# Patient Record
Sex: Female | Born: 1955 | Race: White | Hispanic: No | Marital: Married | Smoking: Current every day smoker
Health system: Southern US, Community
[De-identification: ages and names within clinical notes are randomized; demographics above are authoritative.]

## PROBLEM LIST (undated history)

## (undated) DIAGNOSIS — F419 Anxiety disorder, unspecified: Secondary | ICD-10-CM

## (undated) DIAGNOSIS — F32A Depression, unspecified: Secondary | ICD-10-CM

## (undated) DIAGNOSIS — R059 Cough, unspecified: Secondary | ICD-10-CM

## (undated) DIAGNOSIS — G43909 Migraine, unspecified, not intractable, without status migrainosus: Secondary | ICD-10-CM

## (undated) DIAGNOSIS — K635 Polyp of colon: Secondary | ICD-10-CM

## (undated) DIAGNOSIS — T7840XA Allergy, unspecified, initial encounter: Secondary | ICD-10-CM

## (undated) DIAGNOSIS — R05 Cough: Secondary | ICD-10-CM

## (undated) DIAGNOSIS — F329 Major depressive disorder, single episode, unspecified: Secondary | ICD-10-CM

## (undated) HISTORY — DX: Depression, unspecified: F32.A

## (undated) HISTORY — DX: Anxiety disorder, unspecified: F41.9

## (undated) HISTORY — PX: APPENDECTOMY: SHX54

## (undated) HISTORY — DX: Major depressive disorder, single episode, unspecified: F32.9

## (undated) HISTORY — DX: Migraine, unspecified, not intractable, without status migrainosus: G43.909

## (undated) HISTORY — PX: POLYPECTOMY: SHX149

## (undated) HISTORY — PX: BREAST SURGERY: SHX581

## (undated) HISTORY — PX: COLONOSCOPY: SHX174

## (undated) HISTORY — PX: ABDOMINAL HYSTERECTOMY: SHX81

## (undated) HISTORY — DX: Allergy, unspecified, initial encounter: T78.40XA

---

## 2000-01-15 ENCOUNTER — Other Ambulatory Visit: Admission: RE | Admit: 2000-01-15 | Discharge: 2000-01-15 | Payer: Self-pay | Admitting: Family Medicine

## 2012-03-31 ENCOUNTER — Other Ambulatory Visit: Payer: Self-pay | Admitting: Family Medicine

## 2012-06-22 ENCOUNTER — Ambulatory Visit (INDEPENDENT_AMBULATORY_CARE_PROVIDER_SITE_OTHER): Payer: BC Managed Care – PPO | Admitting: Physician Assistant

## 2012-06-22 ENCOUNTER — Encounter: Payer: Self-pay | Admitting: Physician Assistant

## 2012-06-22 VITALS — BP 138/81 | HR 63 | Temp 97.9°F | Ht 63.75 in | Wt 112.0 lb

## 2012-06-22 DIAGNOSIS — R195 Other fecal abnormalities: Secondary | ICD-10-CM

## 2012-06-22 DIAGNOSIS — J309 Allergic rhinitis, unspecified: Secondary | ICD-10-CM

## 2012-06-22 DIAGNOSIS — E785 Hyperlipidemia, unspecified: Secondary | ICD-10-CM

## 2012-06-22 DIAGNOSIS — F411 Generalized anxiety disorder: Secondary | ICD-10-CM

## 2012-06-22 LAB — POCT CBC
Hemoglobin: 14.1 g/dL (ref 12.2–16.2)
MCHC: 34.2 g/dL (ref 31.8–35.4)
MPV: 6.5 fL (ref 0–99.8)
POC Granulocyte: 4.7 (ref 2–6.9)
POC LYMPH PERCENT: 30.6 %L (ref 10–50)
RDW, POC: 12.8 %

## 2012-06-22 MED ORDER — ALPRAZOLAM 1 MG PO TABS: 1.0000 mg | ORAL_TABLET | Freq: Every evening | ORAL | Status: DC | PRN

## 2012-06-22 MED ORDER — FLUTICASONE PROPIONATE 50 MCG/ACT NA SUSP: 2.0000 | Freq: Every day | NASAL | Status: DC

## 2012-06-22 MED ORDER — OMEGA-3 FATTY ACIDS 1000 MG PO CAPS: 1.0000 g | ORAL_CAPSULE | Freq: Every day | ORAL | Status: DC

## 2012-06-22 MED ORDER — ESCITALOPRAM OXALATE 10 MG PO TABS: 10.0000 mg | ORAL_TABLET | Freq: Every day | ORAL | Status: DC

## 2012-06-22 NOTE — Patient Instructions (Signed)
Allergic Rhinitis Allergic rhinitis is when the mucous membranes in the nose respond to allergens. Allergens are particles in the air that cause your body to have an allergic reaction. This causes you to release allergic antibodies. Through a chain of events, these eventually cause you to release histamine into the blood stream (hence the use of antihistamines). Although meant to be protective to the body, it is this release that causes your discomfort, such as frequent sneezing, congestion and an itchy runny nose.  CAUSES  The pollen allergens may come from grasses, trees, and weeds. This is seasonal allergic rhinitis, or "hay fever." Other allergens cause year-round allergic rhinitis (perennial allergic rhinitis) such as house dust mite allergen, pet dander and mold spores.  SYMPTOMS   Nasal stuffiness (congestion).  Runny, itchy nose with sneezing and tearing of the eyes.  There is often an itching of the mouth, eyes and ears. It cannot be cured, but it can be controlled with medications. DIAGNOSIS  If you are unable to determine the offending allergen, skin or blood testing may find it. TREATMENT   Avoid the allergen.  Medications and allergy shots (immunotherapy) can help.  Hay fever may often be treated with antihistamines in pill or nasal spray forms. Antihistamines block the effects of histamine. There are over-the-counter medicines that may help with nasal congestion and swelling around the eyes. Check with your caregiver before taking or giving this medicine. If the treatment above does not work, there are many new medications your caregiver can prescribe. Stronger medications may be used if initial measures are ineffective. Desensitizing injections can be used if medications and avoidance fails. Desensitization is when a patient is given ongoing shots until the body becomes less sensitive to the allergen. Make sure you follow up with your caregiver if problems continue. SEEK MEDICAL  CARE IF:   You develop fever (more than 100.5 F (38.1 C).  You develop a cough that does not stop easily (persistent).  You have shortness of breath.  You start wheezing.  Symptoms interfere with normal daily activities. Document Released: 09/16/2000 Document Revised: 03/16/2011 Document Reviewed: 03/28/2008 Exit[employer] Patient Information 2014 [employer].  

## 2012-06-22 NOTE — Progress Notes (Signed)
Subjective:     Patient ID: [name], female   DOB: [birth year], 57 y.o.   MRN: [id number]  HPI Pt here for review of her anxiety She states she has been well on current meds She has just been moved to the 1st shift so this has helped She still uses 1/2 of the xanax on occasion at night to help shut her mind down for sleep Pt also with dry cough, congestion, and PND No fever or chills  Review of Systems  All other systems reviewed and are negative.       Objective:   Physical Exam  Nursing note and vitals reviewed. Mood approp and good interaction in room Good eye contact during interview Ears- canals/TM's nl Oral- no lesions, + clear PND No cerv nodes Heart- RRR w/o M Lungs- CTA     Assessment:     1. Heme positive stool   2. Anxiety state, unspecified   3. Other and unspecified hyperlipidemia   4. Allergic rhinitis        Plan:     Add Flonase rx to help with sx Cont other meds and rf done for 6 months   CBC done per [facility] at last visit Pt also given repeat hemoccult due to prev + She is not wanting to do colonoscopy but really stressed the importance Will inform of lab results

## 2012-06-30 ENCOUNTER — Other Ambulatory Visit (INDEPENDENT_AMBULATORY_CARE_PROVIDER_SITE_OTHER): Payer: BC Managed Care – PPO

## 2012-06-30 DIAGNOSIS — Z1212 Encounter for screening for malignant neoplasm of rectum: Secondary | ICD-10-CM

## 2012-07-01 LAB — FECAL OCCULT BLOOD, IMMUNOCHEMICAL: Fecal Occult Blood: POSITIVE — AB

## 2012-07-05 ENCOUNTER — Ambulatory Visit (INDEPENDENT_AMBULATORY_CARE_PROVIDER_SITE_OTHER): Payer: BC Managed Care – PPO | Admitting: Physician Assistant

## 2012-07-05 ENCOUNTER — Encounter: Payer: Self-pay | Admitting: Physician Assistant

## 2012-07-05 VITALS — BP 122/74 | HR 68 | Temp 98.6°F | Wt 116.0 lb

## 2012-07-05 DIAGNOSIS — R195 Other fecal abnormalities: Secondary | ICD-10-CM

## 2012-07-05 DIAGNOSIS — J329 Chronic sinusitis, unspecified: Secondary | ICD-10-CM

## 2012-07-05 MED ORDER — AMOXICILLIN 500 MG PO CAPS: 500.0000 mg | ORAL_CAPSULE | Freq: Three times a day (TID) | ORAL | Status: DC

## 2012-07-05 MED ORDER — BENZONATATE 200 MG PO CAPS: 200.0000 mg | ORAL_CAPSULE | Freq: Two times a day (BID) | ORAL | Status: DC | PRN

## 2012-07-05 NOTE — Patient Instructions (Signed)

## 2012-07-05 NOTE — Progress Notes (Signed)
Subjective:     Patient ID: Alyssa Reyes, female   DOB: 07-Jun-1955, 57 y.o.   MRN: [id number]  HPI Pt with progressive sinus pressure and congestion She has noticed increase in fatigue and general malaise + Prod cough in the am No N/V/D She has used the antihist/Flonase  Review of Systems     Objective:   Physical Exam + Frontal sinus TTP Ears- canals nl, Fluid but landmark remain bilat Oral- Thick post drainage, no increase tonsil size No cerv nodes Heart- RRR w/o M Lungs- CTA    Assessment:     1. Heme positive stool   2. Sinusitis        Plan:     Amox 500mg  tid  for sinus infection  Tessalon for cough Cont other meds She also has had another hem + result so will set up for colonoscopy F/U prn

## 2012-07-12 ENCOUNTER — Encounter: Payer: Self-pay | Admitting: Gastroenterology

## 2012-07-12 ENCOUNTER — Other Ambulatory Visit: Payer: Self-pay | Admitting: Physician Assistant

## 2012-08-15 ENCOUNTER — Encounter: Payer: Self-pay | Admitting: Gastroenterology

## 2012-08-15 ENCOUNTER — Ambulatory Visit (INDEPENDENT_AMBULATORY_CARE_PROVIDER_SITE_OTHER): Payer: BC Managed Care – PPO | Admitting: Gastroenterology

## 2012-08-15 VITALS — BP 120/70 | HR 80 | Ht 63.75 in | Wt 115.4 lb

## 2012-08-15 DIAGNOSIS — R195 Other fecal abnormalities: Secondary | ICD-10-CM

## 2012-08-15 MED ORDER — NA SULFATE-K SULFATE-MG SULF 17.5-3.13-1.6 GM/177ML PO SOLN: 1.0000 | Freq: Once | ORAL | Status: DC

## 2012-08-15 NOTE — Assessment & Plan Note (Signed)
Plan colonoscopy.  Risks, alternatives, and complications of the procedure, including bleeding, perforation, and possible need for surgery, were explained to the patient.  Patient's questions were answered.

## 2012-08-15 NOTE — Progress Notes (Signed)
History of Present Illness: Pleasant, 57 year old white female, referred at the request of Dr. Christell Constant for Hemoccult-positive stool.  This was noted on 2 different occasions.  The patient has no GI complaints, including change of bowel habits, abdominal pain, or rectal bleeding.  Recent CBC was normal.    Past Medical History  Diagnosis Date  . Migraines   . Anxiety    Past Surgical History  Procedure Laterality Date  . Abdominal hysterectomy    . Breast surgery Bilateral     "Knot" removal   . Appendectomy     family history includes Diabetes in her brother, mother, and sister; Heart disease in her brother, father, and mother; Hyperlipidemia in her brother and mother; Hypertension in her brother, mother, and sister; and Stroke in her father. Current Outpatient Prescriptions  Medication Sig Dispense Refill  . ALPRAZolam (XANAX) 1 MG tablet Take 1 tablet (1 mg total) by mouth at bedtime as needed for sleep.  30 tablet  5  . escitalopram (LEXAPRO) 10 MG tablet Take 1 tablet (10 mg total) by mouth daily.  30 tablet  6  . Multiple Vitamins-Minerals (MULTIVITAMIN WITH MINERALS) tablet Take 1 tablet by mouth daily.       No current facility-administered medications for this visit.   Allergies as of 08/15/2012 - Review Complete 08/15/2012  Allergen Reaction Noted  . Erythromycin Nausea And Vomiting 06/22/2012  . Ultracet (tramadol-acetaminophen) Itching 06/22/2012  . Vicodin (hydrocodone-acetaminophen) Nausea And Vomiting 06/22/2012    reports that she has been smoking Cigarettes.  She has a 15 pack-year smoking history. She has never used smokeless tobacco. She reports that she does not drink alcohol or use illicit drugs.     Review of Systems: Pertinent positive and negative review of systems were noted in the above HPI section. All other review of systems were otherwise negative.  Vital signs were reviewed in today's medical record Physical Exam: General: Well developed , well  nourished, no acute distress Skin: anicteric Head: Normocephalic and atraumatic Eyes:  sclerae anicteric, EOMI Ears: Normal auditory acuity Mouth: No deformity or lesions Neck: Supple, no masses or thyromegaly Lungs: Clear throughout to auscultation Heart: Regular rate and rhythm; no murmurs, rubs or bruits Abdomen: Soft, non tender and non distended. No masses, hepatosplenomegaly or hernias noted. Normal Bowel sounds Rectal:deferred Musculoskeletal: Symmetrical with no gross deformities  Skin: No lesions on visible extremities Pulses:  Normal pulses noted Extremities: No clubbing, cyanosis, edema or deformities noted Neurological: Alert oriented x 4, grossly nonfocal Cervical Nodes:  No significant cervical adenopathy Inguinal Nodes: No significant inguinal adenopathy Psychological:  Alert and cooperative. Normal mood and affect

## 2012-08-15 NOTE — Patient Instructions (Addendum)

## 2012-09-29 ENCOUNTER — Ambulatory Visit (INDEPENDENT_AMBULATORY_CARE_PROVIDER_SITE_OTHER): Payer: BC Managed Care – PPO | Admitting: Family Medicine

## 2012-09-29 ENCOUNTER — Encounter: Payer: Self-pay | Admitting: Family Medicine

## 2012-09-29 VITALS — BP 129/70 | HR 71 | Temp 98.7°F | Ht 63.75 in | Wt 117.0 lb

## 2012-09-29 DIAGNOSIS — J209 Acute bronchitis, unspecified: Secondary | ICD-10-CM

## 2012-09-29 DIAGNOSIS — J329 Chronic sinusitis, unspecified: Secondary | ICD-10-CM

## 2012-09-29 MED ORDER — METHYLPREDNISOLONE (PAK) 4 MG PO TABS: ORAL_TABLET | ORAL | Status: DC

## 2012-09-29 MED ORDER — AZITHROMYCIN 250 MG PO TABS: ORAL_TABLET | ORAL | Status: DC

## 2012-09-29 MED ORDER — BENZONATATE 200 MG PO CAPS: 200.0000 mg | ORAL_CAPSULE | Freq: Two times a day (BID) | ORAL | Status: DC | PRN

## 2012-09-29 NOTE — Patient Instructions (Signed)

## 2012-09-29 NOTE — Progress Notes (Signed)
  Subjective:    Patient ID: Alyssa Reyes, female    DOB: 1955-02-10, 57 y.o.   MRN: [id number]  HPI This 57 y.o. female presents for evaluation of URI sx's for over a week.   Review of Systems  No chest pain, SOB, HA, dizziness, vision change, N/V, diarrhea, constipation, dysuria, urinary urgency or frequency, myalgias, arthralgias or rash.     Objective:   Physical Exam Vital signs noted  Well developed well nourished female.  HEENT - Head atraumatic Normocephalic                Eyes - PERRLA, Conjuctiva - clear Sclera- Clear EOMI                Ears - EAC's Wnl TM's Wnl Gross Hearing WNL                Nose - Nares patent                 Throat - oropharanx wnl Respiratory - Lungs CTA bilateral Cardiac - RRR S1 and S2 without murmur        Assessment & Plan:  Sinusitis - Plan: azithromycin (ZITHROMAX) 250 MG tablet, benzonatate (TESSALON) 200 MG capsule  Acute bronchitis - Plan: azithromycin (ZITHROMAX) 250 MG tablet, methylPREDNIsolone (MEDROL DOSPACK) 4 MG tablet, benzonatate (TESSALON) 200 MG capsule  Deatra Canter FNP

## 2012-10-03 ENCOUNTER — Ambulatory Visit (AMBULATORY_SURGERY_CENTER): Payer: BC Managed Care – PPO | Admitting: Gastroenterology

## 2012-10-03 ENCOUNTER — Encounter: Payer: Self-pay | Admitting: Gastroenterology

## 2012-10-03 VITALS — BP 172/82 | HR 63 | Temp 95.2°F | Resp 18 | Ht 63.75 in | Wt 115.0 lb

## 2012-10-03 DIAGNOSIS — D126 Benign neoplasm of colon, unspecified: Secondary | ICD-10-CM

## 2012-10-03 DIAGNOSIS — R195 Other fecal abnormalities: Secondary | ICD-10-CM

## 2012-10-03 MED ORDER — SODIUM CHLORIDE 0.9 % IV SOLN: 500.0000 mL | INTRAVENOUS | Status: DC

## 2012-10-03 NOTE — Progress Notes (Signed)
Called to room to assist during endoscopic procedure.  Patient ID and intended procedure confirmed with present staff. Received instructions for my participation in the procedure from the performing physician.  

## 2012-10-03 NOTE — Progress Notes (Signed)
Patient did not experience any of the following events: a burn prior to discharge; a fall within the facility; wrong site/side/patient/procedure/implant event; or a hospital transfer or hospital admission upon discharge from the facility. (G8907) Patient did not have preoperative order for IV antibiotic SSI prophylaxis. (G8918)  

## 2012-10-03 NOTE — Patient Instructions (Addendum)
YOU HAD AN ENDOSCOPIC PROCEDURE TODAY AT THE [facility]: Refer to the procedure report that was given to you for any specific questions about what was found during the examination.  If the procedure report does not answer your questions, please call your gastroenterologist to clarify.  If you requested that your care partner not be given the details of your procedure findings, then the procedure report has been included in a sealed envelope for you to review at your convenience later.  YOU SHOULD EXPECT: Some feelings of bloating in the abdomen. Passage of more gas than usual.  Walking can help get rid of the air that was put into your GI tract during the procedure and reduce the bloating. If you had a lower endoscopy (such as a colonoscopy or flexible sigmoidoscopy) you may notice spotting of blood in your stool or on the toilet paper. If you underwent a bowel prep for your procedure, then you may not have a normal bowel movement for a few days.  DIET: Your first meal following the procedure should be a light meal and then it is ok to progress to your normal diet.  A half-sandwich or bowl of soup is an example of a good first meal.  Heavy or fried foods are harder to digest and may make you feel nauseous or bloated.  Likewise meals heavy in dairy and vegetables can cause extra gas to form and this can also increase the bloating.  Drink plenty of fluids but you should avoid alcoholic beverages for 24 hours.  ACTIVITY: Your care partner should take you home directly after the procedure.  You should plan to take it easy, moving slowly for the rest of the day.  You can resume normal activity the day after the procedure however you should NOT DRIVE or use heavy machinery for 24 hours (because of the sedation medicines used during the test).    SYMPTOMS TO REPORT IMMEDIATELY: A gastroenterologist can be reached at any hour.  During normal business hours, 8:30 AM to 5:00 PM Monday through Friday,  call [phone].  After hours and on weekends, please call the GI answering service at [phone] who will take a message and have the physician on call contact you.   Following lower endoscopy (colonoscopy or flexible sigmoidoscopy):  Excessive amounts of blood in the stool  Significant tenderness or worsening of abdominal pains  Swelling of the abdomen that is new, acute  Fever of 100F or higher  FOLLOW UP: If any biopsies were taken you will be contacted by phone or by letter within the next 1-3 weeks.  Call your gastroenterologist if you have not heard about the biopsies in 3 weeks.  Our staff will call the home number listed on your records the next business day following your procedure to check on you and address any questions or concerns that you may have at that time regarding the information given to you following your procedure. This is a courtesy call and so if there is no answer at the home number and we have not heard from you through the emergency physician on call, we will assume that you have returned to your regular daily activities without incident.  SIGNATURES/CONFIDENTIALITY: You and/or your care partner have signed paperwork which will be entered into your electronic medical record.  These signatures attest to the fact that that the information above on your After Visit Summary has been reviewed and is understood.  Full responsibility of the confidentiality of this   discharge information lies with you and/or your care-partner.    Resume medications. Information given on polyps with discharge instructions. 

## 2012-10-03 NOTE — Op Note (Signed)
[facility] [address] [place], [address]   COLONOSCOPY PROCEDURE REPORT  PATIENT: Alyssa Reyes, Alyssa Reyes  MR#: [id number] BIRTHDATE: 01-Jul-1955 , 56  yrs. old GENDER: Female ENDOSCOPIST: Louis Meckel, MD REFERRED NW:[id number] Christell Constant, M.D. PROCEDURE DATE:  10/03/2012 PROCEDURE:   Colonoscopy with biopsy, Colonoscopy with snare polypectomy, Submucosal injection, any substance, and Colonoscopy with control of bleeding First Screening Colonoscopy - Avg.  risk and is 50 yrs.  old or older Yes.  Prior Negative Screening - Now for repeat screening. N/A  History of Adenoma - Now for follow-up colonoscopy & has been > or = to 3 yrs.  N/A  Polyps Removed Today? Yes. ASA CLASS:   Class I INDICATIONS:heme-positive stool. MEDICATIONS: MAC sedation, administered by CRNA and propofol (Diprivan) 400mg  IV  DESCRIPTION OF PROCEDURE:   After the risks benefits and alternatives of the procedure were thoroughly explained, informed consent was obtained.  A digital rectal exam revealed no abnormalities of the rectum.   The LB [id number]  endoscope was introduced through the anus and advanced to the cecum, which was identified by both the appendix and ileocecal valve. No adverse events experienced.   The quality of the prep was excellent using Suprep  The instrument was then slowly withdrawn as the colon was fully examined.      COLON FINDINGS: In the cecum there was a flat, sessile polyp with some nodularity encompassing at least 50% of the circumference of the cecum.  Multiple biopsies were taken. In the ascending colon there was a 15 mm flat, sessile polyp.  After injecting 4 cc submucosally of normal saline, the polyp was removed with hot polypectomy snare and submitted to pathology.  Polyp remnants were removed with a hot biopsy forceps.  Because of mild but persistent bleeding 2 endoscopic clips were applied, achieving hemostasis. In the splenic flexure there was a 1  cm sessile polyp.  This was removed with cold polypectomy snare and submitted to pathology. Retroflexed views revealed no abnormalities. The time to cecum=4 minutes 06 seconds.  Withdrawal time=21 minutes 15 seconds.  The scope was withdrawn and the procedure completed. COMPLICATIONS: There were no complications.  ENDOSCOPIC IMPRESSION: In the cecum there was a flat, sessile polyp with some nodularity encompassing at least 50% of the circumference of the cecum. Multiple biopsies were taken. In the ascending colon there was a 15 mm flat, sessile polyp.  After injecting 4 cc submucosally of normal saline, the polyp was removed with hot polypectomy snare and submitted to pathology.  Polyp remnants were removed with a hot biopsy forceps.   Because of mild but persistent bleeding 2 endoscopic clips were applied, achieving hemostasis. In the splenic flexure there was a 1 cm sessile polyp.  This was removed with cold polypectomy snare and submitted to pathology.  RECOMMENDATIONS: 1.  My office will arrange for you to meet with a surgeon. 2.  Colonoscopy in 1 year with [facility]   eSigned:  Louis Meckel, MD 10/03/2012 2:51 PM   cc: Dr. Karie Soda   PATIENT NAME:  Alyssa Reyes, Alyssa Reyes MR#: [id number]

## 2012-10-04 ENCOUNTER — Telehealth: Payer: Self-pay | Admitting: *Deleted

## 2012-10-04 ENCOUNTER — Other Ambulatory Visit: Payer: Self-pay

## 2012-10-04 DIAGNOSIS — K635 Polyp of colon: Secondary | ICD-10-CM

## 2012-10-04 NOTE — Telephone Encounter (Signed)
  Follow up Call-  Call back number 10/03/2012  Post procedure Call Back phone  # [phone]  Permission to leave phone message Yes     Patient questions:  Do you have a fever, pain , or abdominal swelling? no Pain Score  0 *  Have you tolerated food without any problems? No  Patient states not much of an appetite.  Encouraged to eat a little.  Have you been able to return to your normal activities? yes  Do you have any questions about your discharge instructions: Diet   no Medications  no Follow up visit  no  Do you have questions or concerns about your Care? no  Actions: * If pain score is 4 or above: No action needed, pain <4.

## 2012-10-07 ENCOUNTER — Telehealth: Payer: Self-pay | Admitting: Gastroenterology

## 2012-10-07 NOTE — Telephone Encounter (Signed)
Spoke with pt and let her know that the path results are not back yet.

## 2012-10-10 ENCOUNTER — Ambulatory Visit (INDEPENDENT_AMBULATORY_CARE_PROVIDER_SITE_OTHER): Payer: BC Managed Care – PPO | Admitting: Surgery

## 2012-10-10 ENCOUNTER — Encounter (INDEPENDENT_AMBULATORY_CARE_PROVIDER_SITE_OTHER): Payer: Self-pay | Admitting: Surgery

## 2012-10-10 VITALS — BP 110/68 | HR 76 | Temp 98.9°F | Resp 14 | Ht 63.0 in | Wt 114.8 lb

## 2012-10-10 DIAGNOSIS — Z720 Tobacco use: Secondary | ICD-10-CM | POA: Insufficient documentation

## 2012-10-10 DIAGNOSIS — D126 Benign neoplasm of colon, unspecified: Secondary | ICD-10-CM | POA: Insufficient documentation

## 2012-10-10 MED ORDER — METRONIDAZOLE 500 MG PO TABS: 500.0000 mg | ORAL_TABLET | ORAL | Status: DC

## 2012-10-10 MED ORDER — NEOMYCIN SULFATE 500 MG PO TABS: 1000.0000 mg | ORAL_TABLET | ORAL | Status: DC

## 2012-10-10 NOTE — Progress Notes (Signed)
Subjective:     Patient ID: Alyssa Reyes, female   DOB: 04/18/1955, 57 y.o.   MRN: [id number]  HPI  Alyssa Reyes  10/14/1955 [id number]  Patient Care Team: Donald W Moore, MD as PCP - General (Family Medicine)  This patient is a 57 y.o.female who presents today for surgical evaluation at the request of Dr. Kaplan.   Reason for visit: Colon polyps.  Large one in cecum, not resectable by colonoscopy  Pleasant smoking female.  She comes today with her husband.  Has had heme-positive stools in the past.  Underwent colonoscopy.  Found to have a two polyps.  Small ones able to be removed.  A larger one was found in the cecum.  Flat/sessile.  Involving 50% of the circumference.  Biopsy consistent with adenomatous polyp.  Because it was not felt safe to be removed endoscopically, surgical consultation requested.    Patient had a hysterectomy with appendectomy done 30 years ago.  No other abdominal surgeries.  No history of bowel obstructions.  Has some occasionally irregular bowels.  Usually a bowel movement every day but sometimes twice a week to twice a day.  She can walk several miles without any difficulty.  Does struggle with anxiety and finds smoking to be relaxing.  No history of MRSA or wound infections.  No history of wound infections.  No personal nor family history of GI/colon cancer, inflammatory bowel disease, irritable bowel syndrome, allergy such as Celiac Sprue, dietary/dairy problems, colitis, ulcers nor gastritis.  No recent sick contacts/gastroenteritis.  No travel outside the country.  No changes in diet.    Patient Active Problem List   Diagnosis Date Noted  . Adenomatous colon polyp - large, in cecum 10/10/2012  . Tobacco abuse 10/10/2012  . Nonspecific abnormal finding in stool contents 08/15/2012  . Anxiety state, unspecified 06/22/2012  . Allergic rhinitis 06/22/2012    Past Medical History  Diagnosis Date  . Migraines   . Anxiety   . Depression     Past  Surgical History  Procedure Laterality Date  . Abdominal hysterectomy  1980s  . Breast surgery Bilateral     "Knot" removal   . Appendectomy  1980S    History   Social History  . Marital Status: Married    Spouse Name: N/A    Number of Children: 2  . Years of Education: N/A   Occupational History  . Inspector [employer]   Social History Main Topics  . Smoking status: Current Every Day Smoker -- 1.00 packs/day for 15 years    Types: Cigarettes  . Smokeless tobacco: Never Used  . Alcohol Use: 0.6 oz/week    1 Cans of beer per week     Comment: on occasions  . Drug Use: No  . Sexual Activity: Not on file   Other Topics Concern  . Not on file   Social History Narrative  . No narrative on file    Family History  Problem Relation Age of Onset  . Diabetes Mother   . Hyperlipidemia Mother   . Hypertension Mother   . Heart disease Mother   . Stroke Father   . Heart disease Father   . Diabetes Sister   . Hypertension Sister   . Diabetes Brother   . Heart disease Brother   . Hyperlipidemia Brother   . Hypertension Brother     Current Outpatient Prescriptions  Medication Sig Dispense Refill  . ALPRAZolam (XANAX) 1 MG tablet Take 1 tablet (1 mg   total) by mouth at bedtime as needed for sleep.  30 tablet  5  . escitalopram (LEXAPRO) 10 MG tablet Take 1 tablet (10 mg total) by mouth daily.  30 tablet  6  . fish oil-omega-3 fatty acids 1000 MG capsule Take 2 g by mouth daily.      . Multiple Vitamins-Minerals (MULTIVITAMIN WITH MINERALS) tablet Take 1 tablet by mouth daily.       No current facility-administered medications for this visit.     Allergies  Allergen Reactions  . Erythromycin Nausea And Vomiting  . Ultracet [Tramadol-Acetaminophen] Itching  . Vicodin [Hydrocodone-Acetaminophen] Nausea And Vomiting    BP 110/68  Pulse 76  Temp(Src) 98.9 F (37.2 C) (Temporal)  Resp 14  Ht 5' 3" (1.6 m)  Wt 114 lb 12.8 oz (52.073 kg)  BMI 20.34 kg/m2  No  results found.   Review of Systems  Constitutional: Negative for fever, chills, diaphoresis, appetite change and fatigue.  HENT: Negative for ear pain, sore throat, trouble swallowing, neck pain and ear discharge.   Eyes: Negative for photophobia, discharge and visual disturbance.  Respiratory: Negative for cough, choking, chest tightness, shortness of breath, wheezing and stridor.   Cardiovascular: Negative for chest pain and palpitations.  Gastrointestinal: Positive for abdominal pain and constipation. Negative for nausea, vomiting, diarrhea, anal bleeding and rectal pain.  Endocrine: Negative for cold intolerance and heat intolerance.  Genitourinary: Negative for dysuria, frequency and difficulty urinating.  Musculoskeletal: Negative for myalgias and gait problem.  Skin: Negative for color change, pallor and rash.  Allergic/Immunologic: Negative for environmental allergies, food allergies and immunocompromised state.  Neurological: Negative for dizziness, speech difficulty, weakness and numbness.  Hematological: Negative for adenopathy.  Psychiatric/Behavioral: Negative for confusion and agitation. The patient is not nervous/anxious.        Objective:   Physical Exam  Constitutional: She is oriented to person, place, and time. She appears well-developed and well-nourished. No distress.  HENT:  Head: Normocephalic.  Mouth/Throat: Oropharynx is clear and moist. No oropharyngeal exudate.  Eyes: Conjunctivae and EOM are normal. Pupils are equal, round, and reactive to light. No scleral icterus.  Neck: Normal range of motion. Neck supple. No tracheal deviation present.  Cardiovascular: Normal rate, regular rhythm and intact distal pulses.   Pulmonary/Chest: Effort normal and breath sounds normal. No stridor. No respiratory distress. She exhibits no tenderness.  Abdominal: Soft. She exhibits no distension, no pulsatile liver and no mass. There is tenderness in the right lower quadrant.  There is no rigidity, no rebound, no guarding, no CVA tenderness, no tenderness at McBurney's point and negative Murphy's sign. No hernia. Hernia confirmed negative in the ventral area, confirmed negative in the right inguinal area and confirmed negative in the left inguinal area.  Supraumbilical piercing.  No hernias  Genitourinary: No vaginal discharge found.  Musculoskeletal: Normal range of motion. She exhibits no tenderness.       Right elbow: She exhibits normal range of motion.       Left elbow: She exhibits normal range of motion.       Right wrist: She exhibits normal range of motion.       Left wrist: She exhibits normal range of motion.       Right hand: Normal strength noted.       Left hand: Normal strength noted.  Lymphadenopathy:       Head (right side): No posterior auricular adenopathy present.       Head (left side): No posterior auricular adenopathy   present.    She has no cervical adenopathy.    She has no axillary adenopathy.       Right: No inguinal adenopathy present.       Left: No inguinal adenopathy present.  Neurological: She is alert and oriented to person, place, and time. No cranial nerve deficit. She exhibits normal muscle tone. Coordination normal.  Skin: Skin is warm and dry. No rash noted. She is not diaphoretic. No erythema.  Psychiatric: She has a normal mood and affect. Her behavior is normal. Judgment and thought content normal.       Assessment:     Large flat polyp of cecum, not resectable by colonoscopy.  Pathology consistent with adenomatous polyp.     Plan:     I think this requires surgical segmental resection.  She would be a good robotic/laparoscopic candidate.  She is interested in a robotic option.  We do have an opening tomorrow.  She would like to take that opening, but is concerned that she cannot get enough notice from work to do that. I discussed it with the patient and her husband.  They are interested in surgery:  The anatomy &  physiology of the digestive tract was discussed.  The pathophysiology was discussed.  Natural history risks without surgery was discussed.   I feel the risks of no intervention will lead to serious problems that outweigh the operative risks; therefore, I recommended a partial colectomy to remove the pathology.  Robotic, laparoscopic & open techniques were discussed.   Risks such as bleeding, infection, abscess, leak, reoperation, possible ostomy, prolonged operative time, injury to other organs, hernia, heart attack, death, and other risks were discussed.  I noted a good likelihood this will help address the problem.   Goals of post-operative recovery were discussed as well.  We will work to minimize complications.  An educational handout on the pathology was given as well.  Questions were answered.  The patient expresses understanding & wishes to proceed with surgery.  We talked to the patient about the dangers of smoking.  We stressed that tobacco use dramatically increases the risk of peri-operative complications such as infection, tissue necrosis leaving to problems with incision/wound and organ healing, heart attack, stroke, DVT, pulmonary embolism, and death.  We noted there are programs in our community to help stop smoking.       

## 2012-10-10 NOTE — Patient Instructions (Addendum)
See the Handout(s) we gave you.  Consider surgery.  Please call our office at [phone] if you wish to schedule surgery or if you have further questions / concerns.   Colon Polyps Polyps are lumps of extra tissue growing inside the body. Polyps can grow in the large intestine (colon). Most colon polyps are noncancerous (benign). However, some colon polyps can become cancerous over time. Polyps that are larger than a pea may be harmful. To be safe, caregivers remove and test all polyps. CAUSES  Polyps form when mutations in the genes cause your cells to grow and divide even though no more tissue is needed. RISK FACTORS There are a number of risk factors that can increase your chances of getting colon polyps. They include:  Being older than 50 years.  Family history of colon polyps or colon cancer.  Long-term colon diseases, such as colitis or Crohn disease.  Being overweight.  Smoking.  Being inactive.  Drinking too much alcohol. SYMPTOMS  Most small polyps do not cause symptoms. If symptoms are present, they may include:  Blood in the stool. The stool may look dark red or black.  Constipation or diarrhea that lasts longer than 1 week. DIAGNOSIS People often do not know they have polyps until their caregiver finds them during a regular checkup. Your caregiver can use 4 tests to check for polyps:  Digital rectal exam. The caregiver wears gloves and feels inside the rectum. This test would find polyps only in the rectum.  Barium enema. The caregiver puts a liquid called barium into your rectum before taking X-rays of your colon. Barium makes your colon look white. Polyps are dark, so they are easy to see in the X-ray pictures.  Sigmoidoscopy. A thin, flexible tube (sigmoidoscope) is placed into your rectum. The sigmoidoscope has a light and tiny camera in it. The caregiver uses the sigmoidoscope to look at the last third of your colon.  Colonoscopy. This test is like  sigmoidoscopy, but the caregiver looks at the entire colon. This is the most common method for finding and removing polyps. TREATMENT  Any polyps will be removed during a sigmoidoscopy or colonoscopy. The polyps are then tested for cancer. PREVENTION  To help lower your risk of getting more colon polyps:  Eat plenty of fruits and vegetables. Avoid eating fatty foods.  Do not smoke.  Avoid drinking alcohol.  Exercise every day.  Lose weight if recommended by your caregiver.  Eat plenty of calcium and folate. Foods that are rich in calcium include milk, cheese, and broccoli. Foods that are rich in folate include chickpeas, kidney beans, and spinach. HOME CARE INSTRUCTIONS Keep all follow-up appointments as directed by your caregiver. You may need periodic exams to check for polyps. SEEK MEDICAL CARE IF: You notice bleeding during a bowel movement. Document Released: 09/18/2003 Document Revised: 03/16/2011 Document Reviewed: 03/03/2011 [facility] Patient Information 2014 [place], [place].   COLON PREP INSTRUCTIONS for upper/proximal colectomy:   Obtain what you need at a pharmacy of your choice:      Prescriptions for your oral antibiotics (Neomycin & Metronidazole)     A bottle of Milk of Magnesia   DAY PRIOR TO SURGERY:    1:00pm    o Take 2 oz (4 tablespoons) Milk of Magnesia. o Drink plenty of liquids o Take 2 Neomycin 500mg  tablets & 2 Metronidazole 500mg  tablets     3:00pm:    o Take 2 Neomycin 500mg  tablets & 2 Metronidazole 500mg  tablets  o Drink  plenty of liquids    Bedtime (~10:00pm)  o Take 2 Neomycin 500mg  tablets & 2 Metronidazole 500mg  tablets o Drink plenty of liquids    Midnight:  Do not eat or drink anything after midnight the night before your surgery.   MORNING OF PROCEDURE:    Remember to not to drink or eat anything that morning      If you have questions or problems, please call [facility] [phone] to speak to someone in the  clinic department at our office    ABDOMINAL SURGERY: POST OP INSTRUCTIONS  1. DIET: Follow a light bland diet the first 24 hours after arrival home, such as soup, liquids, crackers, etc.  Be sure to include lots of fluids daily.  Avoid fast food or heavy meals as your are more likely to get nauseated.  Eat a low fat the next few days after surgery.   2. Take your usually prescribed home medications unless otherwise directed. 3. PAIN CONTROL: a. Pain is best controlled by a usual combination of three different methods TOGETHER: i. Ice/Heat ii. Over the counter pain medication iii. Prescription pain medication b. Most patients will experience some swelling and bruising around the incisions.  Ice packs or heating pads (30-60 minutes up to 6 times a day) will help. Use ice for the first few days to help decrease swelling and bruising, then switch to heat to help relax tight/sore spots and speed recovery.  Some people prefer to use ice alone, heat alone, alternating between ice & heat.  Experiment to what works for you.  Swelling and bruising can take several weeks to resolve.   c. It is helpful to take an over-the-counter pain medication regularly for the first few weeks.  Choose one of the following that works best for you: i. Naproxen (Aleve, etc)  Two 220mg  tabs twice a day ii. Ibuprofen (Advil, etc) Three 200mg  tabs four times a day (every meal & bedtime) iii. Acetaminophen (Tylenol, etc) 500-650mg  four times a day (every meal & bedtime) d. A  prescription for pain medication (such as oxycodone, hydrocodone, etc) should be given to you upon discharge.  Take your pain medication as prescribed.  i. If you are having problems/concerns with the prescription medicine (does not control pain, nausea, vomiting, rash, itching, etc), please call us [phone] to see if we need to switch you to a different pain medicine that will work better for you and/or control your side effect better. ii. If you need  a refill on your pain medication, please contact your pharmacy.  They will contact our office to request authorization. Prescriptions will not be filled after 5 pm or on week-ends. 4. Avoid getting constipated.  Between the surgery and the pain medications, it is common to experience some constipation.  Increasing fluid intake and taking a fiber supplement (such as Metamucil, Citrucel, FiberCon, MiraLax, etc) 1-2 times a day regularly will usually help prevent this problem from occurring.  A mild laxative (prune juice, Milk of Magnesia, MiraLax, etc) should be taken according to package directions if there are no bowel movements after 48 hours.   5. Watch out for diarrhea.  If you have many loose bowel movements, simplify your diet to bland foods & liquids for a few days.  Stop any stool softeners and decrease your fiber supplement.  Switching to mild anti-diarrheal medications (Kayopectate, Pepto Bismol) can help.  If this worsens or does not improve, please call us. 6. Wash / shower every day.  You may shower over the incision / wound.  Avoid baths until the skin is fully healed.  Continue to shower over incision(s) after the dressing is off. 7. Remove your waterproof bandages 5 days after surgery.  You may leave the incision open to air.  You may replace a dressing/Band-Aid to cover the incision for comfort if you wish. 8. ACTIVITIES as tolerated:   a. You may resume regular (light) daily activities beginning the next day-such as daily self-care, walking, climbing stairs-gradually increasing activities as tolerated.  If you can walk 30 minutes without difficulty, it is safe to try more intense activity such as jogging, treadmill, bicycling, low-impact aerobics, swimming, etc. b. Save the most intensive and strenuous activity for last such as sit-ups, heavy lifting, contact sports, etc  Refrain from any heavy lifting or straining until you are off narcotics for pain control.   c. DO NOT PUSH THROUGH PAIN.   Let pain be your guide: If it hurts to do something, don't do it.  Pain is your body warning you to avoid that activity for another week until the pain goes down. d. You may drive when you are no longer taking prescription pain medication, you can comfortably wear a seatbelt, and you can safely maneuver your car and apply brakes. e. Bonita Quin may have sexual intercourse when it is comfortable.  9. FOLLOW UP in our office a. Please call [facility] at [phone] to set up an appointment to see your surgeon in the office for a follow-up appointment approximately 1-2 weeks after your surgery. b. Make sure that you call for this appointment the day you arrive home to insure a convenient appointment time. 10. IF YOU HAVE DISABILITY OR FAMILY LEAVE FORMS, BRING THEM TO THE OFFICE FOR PROCESSING.  DO NOT GIVE THEM TO YOUR DOCTOR.   WHEN TO CALL us [phone]: 1. Poor pain control 2. Reactions / problems with new medications (rash/itching, nausea, etc)  3. Fever over 101.5 F (38.5 C) 4. Inability to urinate 5. Nausea and/or vomiting 6. Worsening swelling or bruising 7. Continued bleeding from incision. 8. Increased pain, redness, or drainage from the incision  The clinic staff is available to answer your questions during regular business hours (8:30am-5pm).  Please don't hesitate to call and ask to speak to one of our nurses for clinical concerns.   A surgeon from [facility] is always on call at the hospitals   If you have a medical emergency, go to the nearest emergency room or call 911.    [facility] [address], [address], [place], [place]  [address] ? MAIN: [phone] ? TOLL FREE: [phone] ? FAX [phone] [email or web address]   GETTING TO GOOD BOWEL HEALTH. Irregular bowel habits such as constipation and diarrhea can lead to many problems over time.  Having one soft bowel movement a day is the most important way to prevent further  problems.  The anorectal canal is designed to handle stretching and feces to safely manage our ability to get rid of solid waste (feces, poop, stool) out of our body.  BUT, hard constipated stools can act like ripping concrete bricks and diarrhea can be a burning fire to this very sensitive area of our body, causing inflamed hemorrhoids, anal fissures, increasing risk is perirectal abscesses, abdominal pain/bloating, an making irritable bowel worse.     The goal: ONE SOFT BOWEL MOVEMENT A DAY!  To have soft, regular bowel movements:    Drink at least  8 tall glasses of water a day.     Take plenty of fiber.  Fiber is the undigested part of plant food that passes into the colon, acting s "natures broom" to encourage bowel motility and movement.  Fiber can absorb and hold large amounts of water. This results in a larger, bulkier stool, which is soft and easier to pass. Work gradually over several weeks up to 6 servings a day of fiber (25g a day even more if needed) in the form of: o Vegetables -- Root (potatoes, carrots, turnips), leafy green (lettuce, salad greens, celery, spinach), or cooked high residue (cabbage, broccoli, etc) o Fruit -- Fresh (unpeeled skin & pulp), Dried (prunes, apricots, cherries, etc ),  or stewed ( applesauce)  o Whole grain breads, pasta, etc (whole wheat)  o Bran cereals    Bulking Agents -- This type of water-retaining fiber generally is easily obtained each day by one of the following:  o Psyllium bran -- The psyllium plant is remarkable because its ground seeds can retain so much water. This product is available as Metamucil, Konsyl, Effersyllium, Per Diem Fiber, or the less expensive generic preparation in drug and health food stores. Although labeled a laxative, it really is not a laxative.  o Methylcellulose -- This is another fiber derived from wood which also retains water. It is available as Citrucel. o Polyethylene Glycol - and "artificial" fiber commonly called Miralax  or Glycolax.  It is helpful for people with gassy or bloated feelings with regular fiber o Flax Seed - a less gassy fiber than psyllium   No reading or other relaxing activity while on the toilet. If bowel movements take longer than 5 minutes, you are too constipated   AVOID CONSTIPATION.  High fiber and water intake usually takes care of this.  Sometimes a laxative is needed to stimulate more frequent bowel movements, but    Laxatives are not a good long-term solution as it can wear the colon out. o Osmotics (Milk of Magnesia, Fleets phosphosoda, Magnesium citrate, MiraLax, GoLytely) are safer than  o Stimulants (Senokot, Castor Oil, Dulcolax, Ex Lax)    o Do not take laxatives for more than 7days in a row.    IF SEVERELY CONSTIPATED, try a Bowel Retraining Program: o Do not use laxatives.  o Eat a diet high in roughage, such as bran cereals and leafy vegetables.  o Drink six (6) ounces of prune or apricot juice each morning.  o Eat two (2) large servings of stewed fruit each day.  o Take one (1) heaping tablespoon of a psyllium-based bulking agent twice a day. Use sugar-free sweetener when possible to avoid excessive calories.  o Eat a normal breakfast.  o Set aside 15 minutes after breakfast to sit on the toilet, but do not strain to have a bowel movement.  o If you do not have a bowel movement by the third day, use an enema and repeat the above steps.    Controlling diarrhea o Switch to liquids and simpler foods for a few days to avoid stressing your intestines further. o Avoid dairy products (especially milk & ice cream) for a short time.  The intestines often can lose the ability to digest lactose when stressed. o Avoid foods that cause gassiness or bloating.  Typical foods include beans and other legumes, cabbage, broccoli, and dairy foods.  Every person has some sensitivity to other foods, so listen to our body and avoid those foods that trigger problems for you.  o Adding fiber  (Citrucel, Metamucil, psyllium, Miralax) gradually can help thicken stools by absorbing excess fluid and retrain the intestines to act more normally.  Slowly increase the dose over a few weeks.  Too much fiber too soon can backfire and cause cramping & bloating. o Probiotics (such as active yogurt, Align, etc) may help repopulate the intestines and colon with normal bacteria and calm down a sensitive digestive tract.  Most studies show it to be of mild help, though, and such products can be costly. o Medicines:   Bismuth subsalicylate (ex. Kayopectate, Pepto Bismol) every 30 minutes for up to 6 doses can help control diarrhea.  Avoid if pregnant.   Loperamide (Immodium) can slow down diarrhea.  Start with two tablets (4mg  total) first and then try one tablet every 6 hours.  Avoid if you are having fevers or severe pain.  If you are not better or start feeling worse, stop all medicines and call your doctor for advice o Call your doctor if you are getting worse or not better.  Sometimes further testing (cultures, endoscopy, X-ray studies, bloodwork, etc) may be needed to help diagnose and treat the cause of the diarrhea. o  We strongly recommend that you stop smoking.  Smoking increases the risk of surgery including infection in the form of an open wound, pus formation, abscess, hernia at an incision on the abdomen, etc.  You have an increased risk of other MAJOR complications such as stroke, heart attack, forming clots in the leg and/or lungs, and death.    While it can be one of the most difficult things to do, the [place] community has programs to help you stop.  Consider talking with your primary care physician about options.  Also, Smoking Cessation classes are available through the [facility]:  The smoking cessation program is a proven-effective program from the American Lung Association. The program is available for anyone 13 and older who currently smokes. The program lasts for 7 weeks and is 8  sessions. Each class will be approximately 1 1/2 hours. The program is every Tuesday.  All classes are 12-1:30pm and same location.  Event Location Information:  Location: [facility] 2nd Floor Conference Room 2-037; located next to [facility] cross streets: [address] & [facility] Entrance into the [facility] is adjacent to the [facility] main entrance. The conference room is located on the 2nd floor.  Parking Instructions: Visitor parking is adjacent to [employer] main entrance and the [employer] [phone] or check the Classes and Support Groups   [email or web address] the event of inclemet weather please call [phone] or view online at [email or web address]

## 2012-10-11 ENCOUNTER — Encounter: Payer: Self-pay | Admitting: Gastroenterology

## 2012-10-12 ENCOUNTER — Telehealth (INDEPENDENT_AMBULATORY_CARE_PROVIDER_SITE_OTHER): Payer: Self-pay

## 2012-10-12 NOTE — Telephone Encounter (Signed)
LMOM for pt to call me back so I can explain that we are trying to check with the robotic reps about getting earlier time for the robot than December time that is available for the robot. If the rep can't get earlier than it will be up to the pt wether she wants to go ahead with laparoscopic surgery with Dr Michaell Cowing or wait till December to do the robotic case.

## 2012-10-12 NOTE — Telephone Encounter (Signed)
Message copied by Ethlyn Gallery on Wed Oct 12, 2012  9:30 AM ------      Message from: Docia Chuck      Created: Tue Oct 11, 2012 11:51 AM      Regarding: Do you want to do as Laparoscopic       I can not do this case with robotic's until December do you want me to wait until then or get her on earlier done as a lap?            Thanks ------

## 2012-10-19 NOTE — Telephone Encounter (Signed)
Patient called in requesting a note for work.  Patient states she is scheduled now for surgery next Wednesday 10/26/12.  Read to patient the below message from [place] and she said she is fine either way but just wants to move forward with the date next Wednesday.  While talking about the work note patient states that Dr. Michaell Cowing told her he would be writing her out for 4-6 weeks.  I explained to patient that I would have to send Dr. Michaell Cowing a message to verify this and make sure the note is written for the appropriate time.  Patient is going to call and get the fax number for her work and who it needs to be sent too then call us back with that information.  Explained to patient that once I get a message back from Dr. Michaell Cowing and we get the fax number for her work then we can send over the letter for her.  Patient states understanding and agreeable at this time time.

## 2012-10-19 NOTE — Telephone Encounter (Signed)
Letter needs to be sent to [employer] [facility] fax # [phone] once we know from Dr. Michaell Cowing what to write for patient to be out.

## 2012-10-20 ENCOUNTER — Encounter (INDEPENDENT_AMBULATORY_CARE_PROVIDER_SITE_OTHER): Payer: Self-pay | Admitting: *Deleted

## 2012-10-20 ENCOUNTER — Encounter (HOSPITAL_COMMUNITY): Payer: Self-pay | Admitting: Pharmacy Technician

## 2012-10-20 NOTE — Telephone Encounter (Signed)
Letter completed at this time and faxed to patient's work as patient requested.  Confirmation received from fax at this time.  Called patient to let her know.

## 2012-10-20 NOTE — Telephone Encounter (Signed)
4 weeks is fine.  6 weeks to be unrestricted

## 2012-10-21 ENCOUNTER — Telehealth (INDEPENDENT_AMBULATORY_CARE_PROVIDER_SITE_OTHER): Payer: Self-pay

## 2012-10-21 ENCOUNTER — Encounter (INDEPENDENT_AMBULATORY_CARE_PROVIDER_SITE_OTHER): Payer: Self-pay

## 2012-10-21 NOTE — Telephone Encounter (Signed)
I do not know what she is talking about.  She had a work injury?  She can discuss with her primary care physician on that.

## 2012-10-21 NOTE — Telephone Encounter (Signed)
Patient states she was advised by her occupational nurse to ask off additional days for pre op to prevent additional occurrence . Asking for work note  10-24-12 thru 6 weeks . Her job does not offer light duty.Fax [phone] Attention Olegario Messier  Please advise

## 2012-10-21 NOTE — Telephone Encounter (Signed)
LMOM stating that we do have a letter for pt stating that she will be out of work starting 10/24/12 thru 12/05/12 a total of 6 weeks per the pt's request. I faxed it to the attn:Kathy [phone].

## 2012-10-24 ENCOUNTER — Encounter (HOSPITAL_COMMUNITY): Payer: Self-pay

## 2012-10-24 ENCOUNTER — Encounter (HOSPITAL_COMMUNITY)
Admission: RE | Admit: 2012-10-24 | Discharge: 2012-10-24 | Disposition: A | Discharge status: Home / Self Care | Payer: BC Managed Care – PPO | Source: Ambulatory Visit | Attending: Surgery | Admitting: Surgery

## 2012-10-24 ENCOUNTER — Ambulatory Visit (HOSPITAL_COMMUNITY)
Admission: RE | Admit: 2012-10-24 | Discharge: 2012-10-24 | Disposition: A | Discharge status: Home / Self Care | Payer: BC Managed Care – PPO | Source: Ambulatory Visit | Attending: Surgery | Admitting: Surgery

## 2012-10-24 DIAGNOSIS — Z01818 Encounter for other preprocedural examination: Secondary | ICD-10-CM | POA: Insufficient documentation

## 2012-10-24 DIAGNOSIS — Z01812 Encounter for preprocedural laboratory examination: Secondary | ICD-10-CM | POA: Insufficient documentation

## 2012-10-24 DIAGNOSIS — D126 Benign neoplasm of colon, unspecified: Secondary | ICD-10-CM | POA: Insufficient documentation

## 2012-10-24 HISTORY — DX: Cough: R05

## 2012-10-24 HISTORY — DX: Cough, unspecified: R05.9

## 2012-10-24 HISTORY — DX: Polyp of colon: K63.5

## 2012-10-24 LAB — BASIC METABOLIC PANEL
BUN: 11 mg/dL (ref 6–23)
CO2: 26 mEq/L (ref 19–32)
Calcium: 10.1 mg/dL (ref 8.4–10.5)
Chloride: 104 mEq/L (ref 96–112)
Creatinine, Ser: 0.7 mg/dL (ref 0.50–1.10)
GFR calc non Af Amer: >90 mL/min (ref >90)
Glucose, Bld: 91 mg/dL (ref 70–99)
Sodium: 138 mEq/L (ref 135–145)

## 2012-10-24 LAB — CBC
HCT: 37.9 % (ref 36.0–46.0)
Hemoglobin: 13.3 g/dL (ref 12.0–15.0)
MCH: 32.8 pg (ref 26.0–34.0)
MCHC: 35.1 g/dL (ref 30.0–36.0)
RBC: 4.06 MIL/uL (ref 3.87–5.11)
WBC: 7.9 10*3/uL (ref 4.0–10.5)

## 2012-10-24 NOTE — Pre-Procedure Instructions (Signed)
CXR WAS DONE TODAY PREOP AT [facility];  EKG NOT NEEDED - PER ANESTHESIOLOGIST'S GUIDELINES. 

## 2012-10-24 NOTE — Patient Instructions (Signed)
YOUR SURGERY IS SCHEDULED AT [facility]  ON:   Wednesday  10/22  REPORT TO [facility] AT:  11:00 AM      PHONE # FOR SHORT STAY IS [phone]  FOLLOW BOWEL PREP INSTRUCTIONS DAY BEFORE SURGERY - INSTRUCTIONS ARE FROM DR. GROSS' OFFICE.  DO NOT EAT OR DRINK ANYTHING AFTER MIDNIGHT THE NIGHT BEFORE YOUR SURGERY.  YOU MAY BRUSH YOUR TEETH, RINSE OUT YOUR MOUTH--BUT NO WATER, NO FOOD, NO CHEWING GUM, NO MINTS, NO CANDIES, NO CHEWING TOBACCO.  PLEASE TAKE THE FOLLOWING MEDICATIONS THE AM OF YOUR SURGERY WITH A FEW SIPS OF WATER:  XANAX IF NEEDED FOR ANXIETY   DO NOT BRING VALUABLES, MONEY, CREDIT CARDS.  DO NOT WEAR JEWELRY, MAKE-UP, NAIL POLISH AND NO METAL PINS OR CLIPS IN YOUR HAIR. CONTACT LENS, DENTURES / PARTIALS, GLASSES SHOULD NOT BE WORN TO SURGERY AND IN MOST CASES-HEARING AIDS WILL NEED TO BE REMOVED.  BRING YOUR GLASSES CASE, ANY EQUIPMENT NEEDED FOR YOUR CONTACT LENS. FOR PATIENTS ADMITTED TO THE HOSPITAL--CHECK OUT TIME THE DAY OF DISCHARGE IS 11:00 AM.  ALL INPATIENT ROOMS ARE PRIVATE - WITH BATHROOM, TELEPHONE, TELEVISION AND WIFI INTERNET.                                 PLEASE READ OVER ANY  FACT SHEETS THAT YOU WERE GIVEN: BLOOD TRANSFUSION INFORMATION FAILURE TO FOLLOW THESE INSTRUCTIONS MAY RESULT IN THE CANCELLATION OF YOUR SURGERY.   PATIENT SIGNATURE_________________________________

## 2012-10-26 ENCOUNTER — Encounter (HOSPITAL_COMMUNITY): Payer: BC Managed Care – PPO | Admitting: Anesthesiology

## 2012-10-26 ENCOUNTER — Inpatient Hospital Stay (HOSPITAL_COMMUNITY)
Admission: RE | Admit: 2012-10-26 | Discharge: 2012-10-28 | DRG: 331 | Disposition: A | Discharge status: Home / Self Care | Payer: BC Managed Care – PPO | Source: Ambulatory Visit | Attending: Surgery | Admitting: Surgery

## 2012-10-26 ENCOUNTER — Ambulatory Visit (HOSPITAL_COMMUNITY): Payer: BC Managed Care – PPO | Admitting: Anesthesiology

## 2012-10-26 ENCOUNTER — Encounter (HOSPITAL_COMMUNITY): Payer: Self-pay | Admitting: *Deleted

## 2012-10-26 ENCOUNTER — Encounter (HOSPITAL_COMMUNITY)
Admission: RE | Disposition: A | Discharge status: Home / Self Care | Payer: Self-pay | Source: Ambulatory Visit | Attending: Surgery

## 2012-10-26 DIAGNOSIS — Z79899 Other long term (current) drug therapy: Secondary | ICD-10-CM

## 2012-10-26 DIAGNOSIS — F3289 Other specified depressive episodes: Secondary | ICD-10-CM | POA: Diagnosis present

## 2012-10-26 DIAGNOSIS — F411 Generalized anxiety disorder: Secondary | ICD-10-CM | POA: Diagnosis present

## 2012-10-26 DIAGNOSIS — Z720 Tobacco use: Secondary | ICD-10-CM | POA: Diagnosis present

## 2012-10-26 DIAGNOSIS — F17200 Nicotine dependence, unspecified, uncomplicated: Secondary | ICD-10-CM | POA: Diagnosis present

## 2012-10-26 DIAGNOSIS — F329 Major depressive disorder, single episode, unspecified: Secondary | ICD-10-CM | POA: Diagnosis present

## 2012-10-26 DIAGNOSIS — D126 Benign neoplasm of colon, unspecified: Secondary | ICD-10-CM | POA: Diagnosis present

## 2012-10-26 HISTORY — PX: LAPAROSCOPIC PARTIAL COLECTOMY: SHX5907

## 2012-10-26 LAB — CREATININE, SERUM
Creatinine, Ser: 0.55 mg/dL (ref 0.50–1.10)
GFR calc Af Amer: >90 mL/min (ref >90)
GFR calc non Af Amer: >90 mL/min (ref >90)

## 2012-10-26 LAB — CBC
HCT: 34.1 % — ABNORMAL LOW (ref 36.0–46.0)
Hemoglobin: 11.8 g/dL — ABNORMAL LOW (ref 12.0–15.0)
MCHC: 34.6 g/dL (ref 30.0–36.0)
MCV: 93.2 fL (ref 78.0–100.0)
RBC: 3.66 MIL/uL — ABNORMAL LOW (ref 3.87–5.11)
WBC: 10.7 10*3/uL — ABNORMAL HIGH (ref 4.0–10.5)

## 2012-10-26 LAB — TYPE AND SCREEN: ABO/RH(D): O POS

## 2012-10-26 SURGERY — LAPAROSCOPIC PARTIAL COLECTOMY
Anesthesia: General | Site: Abdomen | Wound class: Contaminated

## 2012-10-26 MED ORDER — DIPHENHYDRAMINE HCL 50 MG/ML IJ SOLN: 12.5000 mg | Freq: Four times a day (QID) | INTRAMUSCULAR | Status: DC | PRN

## 2012-10-26 MED ORDER — FENTANYL CITRATE 0.05 MG/ML IJ SOLN
INTRAMUSCULAR | Status: DC | PRN
  Administered 2012-10-26: 50 ug via INTRAVENOUS
  Administered 2012-10-26: 100 ug via INTRAVENOUS
  Administered 2012-10-26 (×2): 50 ug via INTRAVENOUS
  Administered 2012-10-26: 100 ug via INTRAVENOUS

## 2012-10-26 MED ORDER — DIPHENHYDRAMINE HCL 12.5 MG/5ML PO ELIX: 12.5000 mg | ORAL_SOLUTION | Freq: Four times a day (QID) | ORAL | Status: DC | PRN

## 2012-10-26 MED ORDER — BUPIVACAINE-EPINEPHRINE 0.25% -1:200000 IJ SOLN
INTRAMUSCULAR | Status: AC
  Filled 2012-10-26: qty 1

## 2012-10-26 MED ORDER — ALVIMOPAN 12 MG PO CAPS
12.0000 mg | ORAL_CAPSULE | Freq: Two times a day (BID) | ORAL | Status: DC
  Administered 2012-10-27: 12 mg via ORAL
  Filled 2012-10-26 (×2): qty 1

## 2012-10-26 MED ORDER — ALUM & MAG HYDROXIDE-SIMETH 200-200-20 MG/5ML PO SUSP: 30.0000 mL | Freq: Four times a day (QID) | ORAL | Status: DC | PRN

## 2012-10-26 MED ORDER — MULTI-VITAMIN/MINERALS PO TABS: 1.0000 | ORAL_TABLET | Freq: Every day | ORAL | Status: DC

## 2012-10-26 MED ORDER — DEXAMETHASONE SODIUM PHOSPHATE 10 MG/ML IJ SOLN
INTRAMUSCULAR | Status: DC | PRN
  Administered 2012-10-26: 5 mg via INTRAVENOUS

## 2012-10-26 MED ORDER — HEPARIN SODIUM (PORCINE) 5000 UNIT/ML IJ SOLN
5000.0000 [IU] | Freq: Once | INTRAMUSCULAR | Status: AC
  Administered 2012-10-26: 5000 [IU] via SUBCUTANEOUS
  Filled 2012-10-26: qty 1

## 2012-10-26 MED ORDER — MIDAZOLAM HCL 5 MG/5ML IJ SOLN
INTRAMUSCULAR | Status: DC | PRN
  Administered 2012-10-26: 2 mg via INTRAVENOUS

## 2012-10-26 MED ORDER — ALPRAZOLAM 1 MG PO TABS
1.0000 mg | ORAL_TABLET | Freq: Every evening | ORAL | Status: DC | PRN
  Administered 2012-10-26: 1 mg via ORAL
  Filled 2012-10-26: qty 1

## 2012-10-26 MED ORDER — PHENYLEPHRINE HCL 10 MG/ML IJ SOLN
INTRAMUSCULAR | Status: DC | PRN
  Administered 2012-10-26: 80 ug via INTRAVENOUS

## 2012-10-26 MED ORDER — GLYCOPYRROLATE 0.2 MG/ML IJ SOLN
INTRAMUSCULAR | Status: DC | PRN
  Administered 2012-10-26: 0.6 mg via INTRAVENOUS

## 2012-10-26 MED ORDER — BUPIVACAINE ON-Q PAIN PUMP (FOR ORDER SET NO CHG)
INJECTION | Status: DC
  Filled 2012-10-26: qty 1

## 2012-10-26 MED ORDER — DEXTROSE 5 % IV SOLN
2.0000 g | INTRAVENOUS | Status: AC
  Administered 2012-10-26: 2 g via INTRAVENOUS
  Filled 2012-10-26: qty 2

## 2012-10-26 MED ORDER — KETOROLAC TROMETHAMINE 30 MG/ML IJ SOLN
INTRAMUSCULAR | Status: DC | PRN
  Administered 2012-10-26: 30 mg via INTRAVENOUS

## 2012-10-26 MED ORDER — CISATRACURIUM BESYLATE (PF) 10 MG/5ML IV SOLN
INTRAVENOUS | Status: DC | PRN
  Administered 2012-10-26: 10 mg via INTRAVENOUS
  Administered 2012-10-26: 4 mg via INTRAVENOUS

## 2012-10-26 MED ORDER — LACTATED RINGERS IR SOLN
Status: DC | PRN
  Administered 2012-10-26: 1000 mL

## 2012-10-26 MED ORDER — ACETAMINOPHEN 500 MG PO TABS
1000.0000 mg | ORAL_TABLET | Freq: Three times a day (TID) | ORAL | Status: DC
  Administered 2012-10-26 – 2012-10-28 (×6): 1000 mg via ORAL
  Filled 2012-10-26 (×8): qty 2

## 2012-10-26 MED ORDER — ESCITALOPRAM OXALATE 10 MG PO TABS
10.0000 mg | ORAL_TABLET | Freq: Every evening | ORAL | Status: DC
  Administered 2012-10-26 – 2012-10-27 (×2): 10 mg via ORAL
  Filled 2012-10-26 (×3): qty 1

## 2012-10-26 MED ORDER — ONDANSETRON HCL 4 MG PO TABS: 4.0000 mg | ORAL_TABLET | Freq: Four times a day (QID) | ORAL | Status: DC | PRN

## 2012-10-26 MED ORDER — NEOSTIGMINE METHYLSULFATE 1 MG/ML IJ SOLN
INTRAMUSCULAR | Status: DC | PRN
  Administered 2012-10-26: 3.5 mg via INTRAVENOUS

## 2012-10-26 MED ORDER — ALVIMOPAN 12 MG PO CAPS
12.0000 mg | ORAL_CAPSULE | Freq: Once | ORAL | Status: AC
  Administered 2012-10-26: 12 mg via ORAL
  Filled 2012-10-26: qty 1

## 2012-10-26 MED ORDER — OXYCODONE HCL 5 MG PO TABS: 5.0000 mg | ORAL_TABLET | ORAL | Status: DC | PRN

## 2012-10-26 MED ORDER — PROPOFOL 10 MG/ML IV BOLUS
INTRAVENOUS | Status: DC | PRN
  Administered 2012-10-26: 140 mg via INTRAVENOUS

## 2012-10-26 MED ORDER — SODIUM CHLORIDE 0.9 % IV SOLN
INTRAVENOUS | Status: AC
  Administered 2012-10-26: 15:00:00 via INTRAPERITONEAL
  Filled 2012-10-26: qty 6

## 2012-10-26 MED ORDER — HYDROMORPHONE HCL PF 1 MG/ML IJ SOLN
INTRAMUSCULAR | Status: DC | PRN
  Administered 2012-10-26: 1 mg via INTRAVENOUS
  Administered 2012-10-26 (×2): 0.5 mg via INTRAVENOUS

## 2012-10-26 MED ORDER — LIDOCAINE HCL (PF) 2 % IJ SOLN
INTRAMUSCULAR | Status: DC | PRN
  Administered 2012-10-26: 75 mg

## 2012-10-26 MED ORDER — KCL IN DEXTROSE-NACL 40-5-0.9 MEQ/L-%-% IV SOLN
INTRAVENOUS | Status: DC
  Administered 2012-10-26 – 2012-10-27 (×2): via INTRAVENOUS
  Filled 2012-10-26 (×4): qty 1000

## 2012-10-26 MED ORDER — LACTATED RINGERS IV SOLN: INTRAVENOUS | Status: DC

## 2012-10-26 MED ORDER — ADULT MULTIVITAMIN W/MINERALS CH
1.0000 | ORAL_TABLET | Freq: Every day | ORAL | Status: DC
  Administered 2012-10-27 – 2012-10-28 (×2): 1 via ORAL
  Filled 2012-10-26 (×2): qty 1

## 2012-10-26 MED ORDER — 0.9 % SODIUM CHLORIDE (POUR BTL) OPTIME
TOPICAL | Status: DC | PRN
  Administered 2012-10-26: 1000 mL

## 2012-10-26 MED ORDER — LACTATED RINGERS IV BOLUS (SEPSIS): 1000.0000 mL | Freq: Three times a day (TID) | INTRAVENOUS | Status: DC | PRN

## 2012-10-26 MED ORDER — HYDROMORPHONE HCL PF 1 MG/ML IJ SOLN
0.5000 mg | INTRAMUSCULAR | Status: DC | PRN
  Administered 2012-10-27 – 2012-10-28 (×2): 1 mg via INTRAVENOUS
  Filled 2012-10-26 (×2): qty 1

## 2012-10-26 MED ORDER — DEXTROSE 5 % IV SOLN
2.0000 g | Freq: Two times a day (BID) | INTRAVENOUS | Status: AC
  Administered 2012-10-26: 2 g via INTRAVENOUS
  Filled 2012-10-26: qty 2

## 2012-10-26 MED ORDER — ZOLPIDEM TARTRATE 5 MG PO TABS: 5.0000 mg | ORAL_TABLET | Freq: Every evening | ORAL | Status: DC | PRN

## 2012-10-26 MED ORDER — SACCHAROMYCES BOULARDII 250 MG PO CAPS
250.0000 mg | ORAL_CAPSULE | Freq: Two times a day (BID) | ORAL | Status: DC
  Administered 2012-10-26 – 2012-10-28 (×4): 250 mg via ORAL
  Filled 2012-10-26 (×5): qty 1

## 2012-10-26 MED ORDER — BUPIVACAINE-EPINEPHRINE 0.25% -1:200000 IJ SOLN
INTRAMUSCULAR | Status: DC | PRN
  Administered 2012-10-26: 50 mL

## 2012-10-26 MED ORDER — BUPIVACAINE 0.25 % ON-Q PUMP DUAL CATH 300 ML
300.0000 mL | INJECTION | Status: DC
  Administered 2012-10-26: 300 mL
  Filled 2012-10-26: qty 300

## 2012-10-26 MED ORDER — HYDROMORPHONE HCL PF 1 MG/ML IJ SOLN
0.2500 mg | INTRAMUSCULAR | Status: DC | PRN
  Administered 2012-10-26 (×2): 0.5 mg via INTRAVENOUS

## 2012-10-26 MED ORDER — LACTATED RINGERS IV SOLN
INTRAVENOUS | Status: DC
  Administered 2012-10-26 (×2): via INTRAVENOUS
  Administered 2012-10-26: 1000 mL via INTRAVENOUS

## 2012-10-26 MED ORDER — ONDANSETRON HCL 4 MG/2ML IJ SOLN
4.0000 mg | Freq: Four times a day (QID) | INTRAMUSCULAR | Status: DC | PRN
  Administered 2012-10-26 – 2012-10-27 (×2): 4 mg via INTRAVENOUS
  Filled 2012-10-26 (×2): qty 2

## 2012-10-26 MED ORDER — HYDROMORPHONE HCL PF 1 MG/ML IJ SOLN
INTRAMUSCULAR | Status: AC
  Filled 2012-10-26: qty 1

## 2012-10-26 MED ORDER — METOPROLOL TARTRATE 1 MG/ML IV SOLN
5.0000 mg | Freq: Four times a day (QID) | INTRAVENOUS | Status: DC | PRN
  Filled 2012-10-26: qty 5

## 2012-10-26 MED ORDER — HEPARIN SODIUM (PORCINE) 5000 UNIT/ML IJ SOLN
5000.0000 [IU] | Freq: Three times a day (TID) | INTRAMUSCULAR | Status: DC
  Administered 2012-10-27 – 2012-10-28 (×5): 5000 [IU] via SUBCUTANEOUS
  Filled 2012-10-26 (×7): qty 1

## 2012-10-26 SURGICAL SUPPLY — 81 items
APPLIER CLIP 5 13 M/L LIGAMAX5 (MISCELLANEOUS)
APPLIER CLIP ROT 10 11.4 M/L (STAPLE)
APR CLP MED LRG 11.4X10 (STAPLE)
APR CLP MED LRG 5 ANG JAW (MISCELLANEOUS)
BLADE EXTENDED COATED 6.5IN (ELECTRODE) ×2 IMPLANT
BLADE HEX COATED 2.75 (ELECTRODE) ×2 IMPLANT
BLADE SURG SZ10 CARB STEEL (BLADE) ×4 IMPLANT
CABLE HIGH FREQUENCY MONO STRZ (ELECTRODE) ×2 IMPLANT
CANISTER SUCTION 2500CC (MISCELLANEOUS) ×2 IMPLANT
CATH KIT ON Q 7.5IN SLV (PAIN MANAGEMENT) ×4 IMPLANT
CELLS DAT CNTRL 66122 CELL SVR (MISCELLANEOUS) ×1 IMPLANT
CLIP APPLIE 5 13 M/L LIGAMAX5 (MISCELLANEOUS) IMPLANT
CLIP APPLIE ROT 10 11.4 M/L (STAPLE) IMPLANT
COUNTER NEEDLE 20 DBL MAG RED (NEEDLE) ×2 IMPLANT
COVER MAYO STAND STRL (DRAPES) ×4 IMPLANT
DECANTER SPIKE VIAL GLASS SM (MISCELLANEOUS) ×2 IMPLANT
DRAIN CHANNEL 19F RND (DRAIN) IMPLANT
DRAPE LAPAROSCOPIC ABDOMINAL (DRAPES) ×2 IMPLANT
DRAPE LG THREE QUARTER DISP (DRAPES) ×4 IMPLANT
DRAPE UTILITY XL STRL (DRAPES) ×4 IMPLANT
DRAPE WARM FLUID 44X44 (DRAPE) ×2 IMPLANT
DRSG OPSITE POSTOP 4X10 (GAUZE/BANDAGES/DRESSINGS) IMPLANT
DRSG OPSITE POSTOP 4X6 (GAUZE/BANDAGES/DRESSINGS) ×2 IMPLANT
DRSG OPSITE POSTOP 4X8 (GAUZE/BANDAGES/DRESSINGS) IMPLANT
DRSG TEGADERM 2-3/8X2-3/4 SM (GAUZE/BANDAGES/DRESSINGS) ×6 IMPLANT
DRSG TEGADERM 4X4.75 (GAUZE/BANDAGES/DRESSINGS) ×2 IMPLANT
ELECT REM PT RETURN 9FT ADLT (ELECTROSURGICAL) ×2
ELECTRODE REM PT RTRN 9FT ADLT (ELECTROSURGICAL) ×1 IMPLANT
ENDOLOOP SUT PDS II  0 18 (SUTURE)
ENDOLOOP SUT PDS II 0 18 (SUTURE) IMPLANT
GLOVE BIOGEL PI IND STRL 7.0 (GLOVE) ×2 IMPLANT
GLOVE BIOGEL PI INDICATOR 7.0 (GLOVE) ×2
GLOVE ECLIPSE 6.5 STRL STRAW (GLOVE) ×4 IMPLANT
GLOVE ECLIPSE 8.0 STRL XLNG CF (GLOVE) ×4 IMPLANT
GLOVE INDICATOR 8.0 STRL GRN (GLOVE) ×4 IMPLANT
GLOVE SS BIOGEL STRL SZ 7 (GLOVE) ×4 IMPLANT
GLOVE SUPERSENSE BIOGEL SZ 7 (GLOVE) ×4
GLOVE SURG SS PI 7.0 STRL IVOR (GLOVE) ×8 IMPLANT
GOWN STRL REIN XL XLG (GOWN DISPOSABLE) ×16 IMPLANT
KIT BASIN OR (CUSTOM PROCEDURE TRAY) ×4 IMPLANT
LEGGING LITHOTOMY PAIR STRL (DRAPES) ×2 IMPLANT
LUBRICANT JELLY K Y 4OZ (MISCELLANEOUS) ×2 IMPLANT
PENCIL BUTTON HOLSTER BLD 10FT (ELECTRODE) ×4 IMPLANT
RTRCTR WOUND ALEXIS 18CM MED (MISCELLANEOUS) ×2
SCISSORS LAP 5X35 DISP (ENDOMECHANICALS) ×2 IMPLANT
SEALER TISSUE G2 CVD JAW 35 (ENDOMECHANICALS) IMPLANT
SEALER TISSUE G2 CVD JAW 45CM (ENDOMECHANICALS)
SEALER TISSUE G2 STRG ARTC 35C (ENDOMECHANICALS) ×2 IMPLANT
SET IRRIG TUBING LAPAROSCOPIC (IRRIGATION / IRRIGATOR) ×4 IMPLANT
SLEEVE XCEL OPT CAN 5 100 (ENDOMECHANICALS) ×6 IMPLANT
SPONGE GAUZE 4X4 12PLY (GAUZE/BANDAGES/DRESSINGS) IMPLANT
SPONGE LAP 18X18 X RAY DECT (DISPOSABLE) ×4 IMPLANT
STAPLER 90 3.5 STAND SLIM (STAPLE) ×2
STAPLER 90 3.5 STD SLIM (STAPLE) ×1 IMPLANT
STAPLER PROXIMATE 75MM BLUE (STAPLE) ×2 IMPLANT
STAPLER VISISTAT 35W (STAPLE) ×2 IMPLANT
SUCTION POOLE TIP (SUCTIONS) ×2 IMPLANT
SUT MNCRL AB 4-0 PS2 18 (SUTURE) ×2 IMPLANT
SUT PDS AB 1 CTX 36 (SUTURE) IMPLANT
SUT PDS AB 1 TP1 96 (SUTURE) IMPLANT
SUT PROLENE 0 CT 2 (SUTURE) IMPLANT
SUT SILK 2 0 (SUTURE) ×1
SUT SILK 2 0 SH CR/8 (SUTURE) ×2 IMPLANT
SUT SILK 2-0 18XBRD TIE 12 (SUTURE) ×1 IMPLANT
SUT SILK 3 0 (SUTURE) ×2
SUT SILK 3 0 SH CR/8 (SUTURE) ×2 IMPLANT
SUT SILK 3-0 18XBRD TIE 12 (SUTURE) ×1 IMPLANT
SYR BULB IRRIGATION 50ML (SYRINGE) ×2 IMPLANT
SYS LAPSCP GELPORT 120MM (MISCELLANEOUS)
SYSTEM LAPSCP GELPORT 120MM (MISCELLANEOUS) IMPLANT
TAPE UMBILICAL COTTON 1/8X30 (MISCELLANEOUS) ×2 IMPLANT
TOWEL OR 17X26 10 PK STRL BLUE (TOWEL DISPOSABLE) ×4 IMPLANT
TOWEL OR NON WOVEN STRL DISP B (DISPOSABLE) ×4 IMPLANT
TRAY FOLEY CATH 14FRSI W/METER (CATHETERS) ×2 IMPLANT
TRAY LAP CHOLE (CUSTOM PROCEDURE TRAY) ×2 IMPLANT
TROCAR BLADELESS OPT 5 100 (ENDOMECHANICALS) ×4 IMPLANT
TROCAR XCEL NON-BLD 11X100MML (ENDOMECHANICALS) IMPLANT
TUBING CONNECTING 10 (TUBING) IMPLANT
TUBING FILTER THERMOFLATOR (ELECTROSURGICAL) ×2 IMPLANT
TUNNELER SHEATH ON-Q 16GX12 DP (PAIN MANAGEMENT) ×2 IMPLANT
YANKAUER SUCT BULB TIP 10FT TU (MISCELLANEOUS) ×4 IMPLANT

## 2012-10-26 NOTE — Anesthesia Preprocedure Evaluation (Signed)
Anesthesia Evaluation  Patient identified by MRN, date of birth, ID band Patient awake    Reviewed: Allergy & Precautions, H&P , NPO status , Patient's Chart, lab work & pertinent test results  Airway Mallampati: II TM Distance: >3 FB Neck ROM: full    Dental no notable dental hx.    Pulmonary neg pulmonary ROS, Current Smoker,  breath sounds clear to auscultation  Pulmonary exam normal       Cardiovascular Exercise Tolerance: Good negative cardio ROS  Rhythm:regular Rate:Normal     Neuro/Psych  Headaches, negative neurological ROS  negative psych ROS   GI/Hepatic negative GI ROS, Neg liver ROS,   Endo/Other  negative endocrine ROS  Renal/GU negative Renal ROS  negative genitourinary   Musculoskeletal   Abdominal   Peds  Hematology negative hematology ROS (+)   Anesthesia Other Findings   Reproductive/Obstetrics negative OB ROS                           Anesthesia Physical Anesthesia Plan  ASA: II  Anesthesia Plan: General   Post-op Pain Management:    Induction: Intravenous  Airway Management Planned: Oral ETT  Additional Equipment:   Intra-op Plan:   Post-operative Plan: Extubation in OR  Informed Consent: I have reviewed the patients History and Physical, chart, labs and discussed the procedure including the risks, benefits and alternatives for the proposed anesthesia with the patient or authorized representative who has indicated his/her understanding and acceptance.   Dental Advisory Given  Plan Discussed with: CRNA and Surgeon  Anesthesia Plan Comments:         Anesthesia Quick Evaluation

## 2012-10-26 NOTE — Interval H&P Note (Signed)
History and Physical Interval Note:  10/26/2012 12:46 PM  Alyssa Reyes  has presented today for surgery, with the diagnosis of large polyp of cecum  The various methods of treatment have been discussed with the patient and family. After consideration of risks, benefits and other options for treatment, the patient has consented to  Procedure(s): LAPAROSCOPIC PARTIAL COLECTOMY;POSSIBLE OPEN (N/A) as a surgical intervention .  The patient's history has been reviewed, patient examined, no change in status, stable for surgery.  I have reviewed the patient's chart and labs.  Questions were answered to the patient's satisfaction.     Jolleen Seman C.

## 2012-10-26 NOTE — Transfer of Care (Signed)
Immediate Anesthesia Transfer of Care Note  Patient: Alyssa Reyes  Procedure(s) Performed: Procedure(s): LAPAROSCOPIC PARTIAL COLECTOMY; (N/A)  Patient Location: PACU  Anesthesia Type:General  Level of Consciousness: awake, alert , oriented and patient cooperative  Airway & Oxygen Therapy: Patient Spontanous Breathing and Patient connected to face mask oxygen  Post-op Assessment: Report given to PACU RN, Post -op Vital signs reviewed and stable and Patient moving all extremities X 4  Post vital signs: stable  Complications: No apparent anesthesia complications

## 2012-10-26 NOTE — Op Note (Signed)
10/26/2012  3:39 PM  PATIENT:  Alyssa Reyes  57 y.o. female  Patient Care Team: Ernestina Penna, MD as PCP - General (Family Medicine) Louis Meckel, MD as Consulting Physician (Gastroenterology)  PRE-OPERATIVE DIAGNOSIS:  large polyp of cecum  POST-OPERATIVE DIAGNOSIS:  large polyp of cecum  PROCEDURE:  Procedure(s): LAPAROSCOPIC PARTIAL COLECTOMY  SURGEON:  Surgeon(s): Ardeth Sportsman, MD  ASSISTANT:  Bhavinkumar "Vin" Bhagat, PA student, [employer]  ANESTHESIA:   local and general  EBL:  Total I/O In: 1000 [I.V.:1000] Out: 275 [Urine:175; Blood:100]  Delay start of Pharmacological VTE agent (>24hrs) due to surgical blood loss or risk of bleeding:  no  DRAINS: none   SPECIMEN:  Source of Specimen:  Proximal colon  DISPOSITION OF SPECIMEN:  PATHOLOGY  COUNTS:  YES  PLAN OF CARE: Admit to inpatient   PATIENT DISPOSITION:  PACU - hemodynamically stable.  INDICATION:    Pleasant woman with a polyps noted on colonoscopy.  Larger polyp in the cecum near for appendiceal orifice noted.  Not safe to remove endoscopically.  Surgical consultation requested for removal.  I recommended segmental resection:  The anatomy & physiology of the digestive tract was discussed.  The pathophysiology was discussed.  Natural history risks without surgery was discussed.   I worked to give an overview of the disease and the frequent need to have multispecialty involvement.  I feel the risks of no intervention will lead to serious problems that outweigh the operative risks; therefore, I recommended a partial colectomy to remove the pathology.  Laparoscopic & open techniques were discussed.   Risks such as bleeding, infection, abscess, leak, reoperation, possible ostomy, hernia, heart attack, death, and other risks were discussed.  I noted a good likelihood this will help address the problem.   Goals of post-operative recovery were discussed as well.  We will work to minimize  complications.  An educational handout on the pathology was given as well.  Questions were answered.    The patient expresses understanding & wishes to proceed with surgery.  OR FINDINGS:   Patient had 4 cm polyp carpeting around the former appendiceal orifice.  Mobile.  No obvious metastatic disease on visceral parietal peritoneum or liver.  It is an ileocolonic anastomosis that rests in the hepatic flexure.  DESCRIPTION:   Informed consent was confirmed.  The patient underwent general anaesthesia without difficulty.  The patient was positioned with arms tucked & secured appropriately.  VTE prevention in place.  The patient's abdomen was clipped, prepped, & draped in a sterile fashion.  Surgical timeout confirmed our plan.  The patient was positioned in reverse Trendelenburg.  Abdominal entry was gained using optical entry technique in the left upper abdomen.  Entry was clean.  I induced carbon dioxide insufflation.  Camera inspection revealed no injury.  Extra ports were carefully placed under direct laparoscopic visualization.  There were some adhesions of greater omentum to the right lower corner and and descending colon.  I carefully freed those off. I mobilized & reflected the greater omentum and small bowel in the upper abdomen.  I was able to elevate the proximal colon to isolate the ileocolonic pedicle.  I scored the ileal mesentery just proximal to that.   I carried that further dissection in a medial to lateral fashion.  I was able to bluntly get into the retro-mesenteric plane on the right side.  Her mesentery was very thin with minimal visceral fat.  I could see her retroperitoneal structures including the  iliacs, ureter, kidney, duodenal sweep, pancreas quite well.  I freed the proximal right sided colonic mesentery off the retroperitoneum including the duodenal sweep, pancreatic head, & Gerota's fascia of the right kidney. I was able to get underneath the hepatic flexure.  I was able  to get underneath the proximal and mid transverse colon.  I isolated the proximal ileocecal pedicle.  I skeletonized it & transected the vessels using the bipolar Enseal system.    I then proceeded to mobilize the terminal ileum & proximal "right" colon in a lateral to medial fashion.  I mobilized the distal ileal mesentery off its retroperitoneal and pelvic attachments.  I mobilized the ascending colon off It is side wall attachments to the paracolic gutter and retroperitoneum.  I also mobilized the greater omentum off the mid transverse colon and mobilized the mid to proximal transverse colon in a superior to inferior fashion.  This allowed me to mobilize the hepatic flexure and get a complete mobilization of the proximal "right" colon to the mid-transverse colon..  I placed an Alexis wound protector through a periumblical midline 6 cm incision.  With that, I was able to eviserate the distal ileum to the transverse colon.  I could isolate the pathology. When he went ahead and proceeded with transection.  I kept a healthy pedicle on the ileal and middle colic side.  We took the intervening mesentery using bipolar energy and occasional clamps and silk ties as well.  We assured hemostasis.   I did a side-to-side stapled anastomosis of ileum to mid-transverse colon using a 75mm GIA stapler.  We then transected the specimen off (including the common bowel defect) using a TX stapler.  The mesenteric defect was wide and broad, therefore, I did not close it.  I protected the TX staple line with an omentopexy of greater omentum.  We did reinspection of the abdomen.  Hemostasis was good.   Ureters, retroperitoneum, and bowel uninjured.  The anastomosis looked healthy.   We did a final irrigation of antibiotic solution (900 mg clindamycin/240 mg gentamicin in a liter of crystalloid) & held that for 10 minutes while we removed the wound protector and changed gown & gloves. The patient was re-draped.  Sterile unused  instruments were used from this point out per colon SSI prevention protocol.       We reinspected the abdomen.  Hemostasis was good.   No injury.  The anastomosis looked healthy.  I placed On-Q catheter and sheaths into the preperitoneal space under direct palpation.  I closed the 5mm port sites using Monocryl stitch and sterile dressing.  I closed the midline incision using #1 PDS running closure. I closed the skin with some interrupted Monocryl stitches. I placed betadine-soaked wicks in between those areas. I placed sterile dressing.  OnQ catheters placed & sheaths peeled away.  Patient is being extubated go to recovery room. I discussed postop care with the patient in detail the office & in the holding area. Instructions are written. I updated the patient's status to the family.  Recommendations were made.  Questions were answered.  The family expressed understanding & appreciation.

## 2012-10-26 NOTE — Anesthesia Postprocedure Evaluation (Signed)
  Anesthesia Post-op Note  Patient: Alyssa Reyes  Procedure(s) Performed: Procedure(s) (LRB): LAPAROSCOPIC PARTIAL COLECTOMY; (N/A)  Patient Location: PACU  Anesthesia Type: General  Level of Consciousness: awake and alert   Airway and Oxygen Therapy: Patient Spontanous Breathing  Post-op Pain: mild  Post-op Assessment: Post-op Vital signs reviewed, Patient's Cardiovascular Status Stable, Respiratory Function Stable, Patent Airway and No signs of Nausea or vomiting  Last Vitals:  Filed Vitals:   10/26/12 2002  BP: 108/63  Pulse: 62  Temp: 36.3 C  Resp: 16    Post-op Vital Signs: stable   Complications: No apparent anesthesia complications

## 2012-10-26 NOTE — H&P (View-Only) (Signed)
Subjective:     Patient ID: Alyssa Reyes, female   DOB: March 10, 1955, 57 y.o.   MRN: [id number]  HPI  RHIA BLATCHFORD  [birth year] [id number]  Patient Care Team: Ernestina Penna, MD as PCP - General (Family Medicine)  This patient is a 57 y.o.female who presents today for surgical evaluation at the request of Dr. Arlyce Dice.   Reason for visit: Colon polyps.  Large one in cecum, not resectable by colonoscopy  Pleasant smoking female.  She comes today with her husband.  Has had heme-positive stools in the past.  Underwent colonoscopy.  Found to have a two polyps.  Small ones able to be removed.  A larger one was found in the cecum.  Flat/sessile.  Involving 50% of the circumference.  Biopsy consistent with adenomatous polyp.  Because it was not felt safe to be removed endoscopically, surgical consultation requested.    Patient had a hysterectomy with appendectomy done 30 years ago.  No other abdominal surgeries.  No history of bowel obstructions.  Has some occasionally irregular bowels.  Usually a bowel movement every day but sometimes twice a week to twice a day.  She can walk several miles without any difficulty.  Does struggle with anxiety and finds smoking to be relaxing.  No history of MRSA or wound infections.  No history of wound infections.  No personal nor family history of GI/colon cancer, inflammatory bowel disease, irritable bowel syndrome, allergy such as Celiac Sprue, dietary/dairy problems, colitis, ulcers nor gastritis.  No recent sick contacts/gastroenteritis.  No travel outside the country.  No changes in diet.    Patient Active Problem List   Diagnosis Date Noted  . Adenomatous colon polyp - large, in cecum 10/10/2012  . Tobacco abuse 10/10/2012  . Nonspecific abnormal finding in stool contents 08/15/2012  . Anxiety state, unspecified 06/22/2012  . Allergic rhinitis 06/22/2012    Past Medical History  Diagnosis Date  . Migraines   . Anxiety   . Depression     Past  Surgical History  Procedure Laterality Date  . Abdominal hysterectomy  1980s  . Breast surgery Bilateral     "Knot" removal   . Appendectomy  1980S    History   Social History  . Marital Status: Married    Spouse Name: N/A    Number of Children: 2  . 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 Smoking status: Current Every Day Smoker -- 1.00 packs/day for 15 years    Types: Cigarettes  . Smokeless tobacco: Never Used  . Alcohol Use: 0.6 oz/week    1 Cans of beer per week     Comment: on occasions  . Drug Use: No  . Sexual Activity: Not on file   Other Topics Concern  . Not on file   Social History Narrative  . No narrative on file    Family History  Problem Relation Age of Onset  . Diabetes Mother   . Hyperlipidemia Mother   . Hypertension Mother   . Heart disease Mother   . Stroke Father   . Heart disease Father   . Diabetes Sister   . Hypertension Sister   . Diabetes Brother   . Heart disease Brother   . Hyperlipidemia Brother   . Hypertension Brother     Current Outpatient Prescriptions  Medication Sig Dispense Refill  . ALPRAZolam (XANAX) 1 MG tablet Take 1 tablet (1 mg  total) by mouth at bedtime as needed for sleep.  30 tablet  5  . escitalopram (LEXAPRO) 10 MG tablet Take 1 tablet (10 mg total) by mouth daily.  30 tablet  6  . fish oil-omega-3 fatty acids 1000 MG capsule Take 2 g by mouth daily.      . Multiple Vitamins-Minerals (MULTIVITAMIN WITH MINERALS) tablet Take 1 tablet by mouth daily.       No current facility-administered medications for this visit.     Allergies  Allergen Reactions  . Erythromycin Nausea And Vomiting  . Ultracet [Tramadol-Acetaminophen] Itching  . Vicodin [Hydrocodone-Acetaminophen] Nausea And Vomiting    BP 110/68  Pulse 76  Temp(Src) 98.9 F (37.2 C) (Temporal)  Resp 14  Ht 5\' 3"  (1.6 m)  Wt 114 lb 12.8 oz (52.073 kg)  BMI 20.34 kg/m2  No  results found.   Review of Systems  Constitutional: Negative for fever, chills, diaphoresis, appetite change and fatigue.  HENT: Negative for ear pain, sore throat, trouble swallowing, neck pain and ear discharge.   Eyes: Negative for photophobia, discharge and visual disturbance.  Respiratory: Negative for cough, choking, chest tightness, shortness of breath, wheezing and stridor.   Cardiovascular: Negative for chest pain and palpitations.  Gastrointestinal: Positive for abdominal pain and constipation. Negative for nausea, vomiting, diarrhea, anal bleeding and rectal pain.  Endocrine: Negative for cold intolerance and heat intolerance.  Genitourinary: Negative for dysuria, frequency and difficulty urinating.  Musculoskeletal: Negative for myalgias and gait problem.  Skin: Negative for color change, pallor and rash.  Allergic/Immunologic: Negative for environmental allergies, food allergies and immunocompromised state.  Neurological: Negative for dizziness, speech difficulty, weakness and numbness.  Hematological: Negative for adenopathy.  Psychiatric/Behavioral: Negative for confusion and agitation. The patient is not nervous/anxious.        Objective:   Physical Exam  Constitutional: She is oriented to person, place, and time. She appears well-developed and well-nourished. No distress.  HENT:  Head: Normocephalic.  Mouth/Throat: Oropharynx is clear and moist. No oropharyngeal exudate.  Eyes: Conjunctivae and EOM are normal. Pupils are equal, round, and reactive to light. No scleral icterus.  Neck: Normal range of motion. Neck supple. No tracheal deviation present.  Cardiovascular: Normal rate, regular rhythm and intact distal pulses.   Pulmonary/Chest: Effort normal and breath sounds normal. No stridor. No respiratory distress. She exhibits no tenderness.  Abdominal: Soft. She exhibits no distension, no pulsatile liver and no mass. There is tenderness in the right lower quadrant.  There is no rigidity, no rebound, no guarding, no CVA tenderness, no tenderness at McBurney's point and negative Murphy's sign. No hernia. Hernia confirmed negative in the ventral area, confirmed negative in the right inguinal area and confirmed negative in the left inguinal area.  Supraumbilical piercing.  No hernias  Genitourinary: No vaginal discharge found.  Musculoskeletal: Normal range of motion. She exhibits no tenderness.       Right elbow: She exhibits normal range of motion.       Left elbow: She exhibits normal range of motion.       Right wrist: She exhibits normal range of motion.       Left wrist: She exhibits normal range of motion.       Right hand: Normal strength noted.       Left hand: Normal strength noted.  Lymphadenopathy:       Head (right side): No posterior auricular adenopathy present.       Head (left side): No posterior auricular adenopathy  present.    She has no cervical adenopathy.    She has no axillary adenopathy.       Right: No inguinal adenopathy present.       Left: No inguinal adenopathy present.  Neurological: She is alert and oriented to person, place, and time. No cranial nerve deficit. She exhibits normal muscle tone. Coordination normal.  Skin: Skin is warm and dry. No rash noted. She is not diaphoretic. No erythema.  Psychiatric: She has a normal mood and affect. Her behavior is normal. Judgment and thought content normal.       Assessment:     Large flat polyp of cecum, not resectable by colonoscopy.  Pathology consistent with adenomatous polyp.     Plan:     I think this requires surgical segmental resection.  She would be a good robotic/laparoscopic candidate.  She is interested in a robotic option.  We do have an opening tomorrow.  She would like to take that opening, but is concerned that she cannot get enough notice from work to do that. I discussed it with the patient and her husband.  They are interested in surgery:  The anatomy &  physiology of the digestive tract was discussed.  The pathophysiology was discussed.  Natural history risks without surgery was discussed.   I feel the risks of no intervention will lead to serious problems that outweigh the operative risks; therefore, I recommended a partial colectomy to remove the pathology.  Robotic, laparoscopic & open techniques were discussed.   Risks such as bleeding, infection, abscess, leak, reoperation, possible ostomy, prolonged operative time, injury to other organs, hernia, heart attack, death, and other risks were discussed.  I noted a good likelihood this will help address the problem.   Goals of post-operative recovery were discussed as well.  We will work to minimize complications.  An educational handout on the pathology was given as well.  Questions were answered.  The patient expresses understanding & wishes to proceed with surgery.  We talked to the patient about the dangers of smoking.  We stressed that tobacco use dramatically increases the risk of peri-operative complications such as infection, tissue necrosis leaving to problems with incision/wound and organ healing, heart attack, stroke, DVT, pulmonary embolism, and death.  We noted there are programs in our community to help stop smoking.

## 2012-10-27 ENCOUNTER — Encounter (HOSPITAL_COMMUNITY): Payer: Self-pay | Admitting: Surgery

## 2012-10-27 LAB — CBC
HCT: 33.1 % — ABNORMAL LOW (ref 36.0–46.0)
MCH: 32.8 pg (ref 26.0–34.0)
MCHC: 35 g/dL (ref 30.0–36.0)
MCV: 93.5 fL (ref 78.0–100.0)
Platelets: 228 10*3/uL (ref 150–400)
RDW: 12.8 % (ref 11.5–15.5)

## 2012-10-27 LAB — BASIC METABOLIC PANEL
CO2: 23 mEq/L (ref 19–32)
Calcium: 9.6 mg/dL (ref 8.4–10.5)
Creatinine, Ser: 0.56 mg/dL (ref 0.50–1.10)
Glucose, Bld: 141 mg/dL — ABNORMAL HIGH (ref 70–99)

## 2012-10-27 NOTE — Care Management Note (Signed)
    Page 1 of 1   10/27/2012     2:06:41 PM   CARE MANAGEMENT NOTE 10/27/2012  Patient:  Alyssa Reyes, Alyssa Reyes   Account Number:  [id number]  Date Initiated:  10/27/2012  Documentation initiated by:  Lorenda Ishihara  Subjective/Objective Assessment:   57 yo female admitted s/p lap colectomy. PTA lived at home with spouse.     Action/Plan:   home when stable   Anticipated DC Date:  10/27/2012   Anticipated DC Plan:  HOME/SELF CARE      DC Planning Services  CM consult      Choice offered to / List presented to:             Status of service:  Completed, signed off Medicare Important Message given?   (If response is "NO", the following Medicare IM given date fields will be blank) Date Medicare IM given:   Date Additional Medicare IM given:    Discharge Disposition:  HOME/SELF CARE  Per UR Regulation:  Reviewed for med. necessity/level of care/duration of stay  If discussed at Long Length of Stay Meetings, dates discussed:    Comments:

## 2012-10-27 NOTE — Progress Notes (Signed)
Alyssa Reyes [id number] 08-16-1955  CARE TEAM:  PCP: Rudi Heap, MD  Outpatient Care Team: Patient Care Team: Alyssa Penna, MD as PCP - General (Family Medicine) Louis Meckel, MD as Consulting Physician (Gastroenterology)  Inpatient Treatment Team: Treatment Team: Attending Provider: Ardeth Sportsman, MD; Registered Nurse: Romeo Rabon, RN; Technician: Hillery Aldo, NT; Registered Nurse: Bethann Goo, RN   Subjective:  Denies pain Foley out Walked in hallways Tol clears, esp Sprite  Objective:  Vital signs:  Filed Vitals:   10/26/12 1808 10/26/12 2002 10/27/12 0114 10/27/12 0546  BP: 107/63 108/63 126/69 121/68  Pulse: 61 62 68 69  Temp: 97.6 F (36.4 C) 97.4 F (36.3 C) 98.1 F (36.7 C) 97.8 F (36.6 C)  TempSrc: Oral Oral Oral Oral  Resp: 16 16 16 14   Height:      Weight:    117 lb 8 oz (53.298 kg)  SpO2: 97% 95% 97% 98%       Intake/Output   Yesterday:  10/22 0701 - 10/23 0700 In: 3197.5 [P.O.:240; I.V.:2957.5] Out: 850 [Urine:750; Blood:100] This shift:     Bowel function:  Flatus: n  BM: n  Drain: n/a  Physical Exam:  General: Pt awake/alert/oriented x4 in no acute distress Eyes: PERRL, normal EOM.  Sclera clear.  No icterus Neuro: CN II-XII intact w/o focal sensory/motor deficits. Lymph: No head/neck/groin lymphadenopathy Psych:  No delerium/psychosis/paranoia HENT: Normocephalic, Mucus membranes moist.  No thrush Neck: Supple, No tracheal deviation Chest: No chest wall pain w good excursion CV:  Pulses intact.  Regular rhythm MS: Normal AROM mjr joints.  No obvious deformity Abdomen: Soft.  Nondistended.  Nontender at incisions.  OnQ in place w/o leak.  No evidence of peritonitis.  No incarcerated hernias. Ext:  SCDs BLE.  No mjr edema.  No cyanosis Skin: No petechiae / purpura   Problem List:   Principal Problem:   Adenomatous colon polyp - large, in cecum Active Problems:   Anxiety state, unspecified  Tobacco abuse   Assessment  Ste[place] Sink  57 y.o. female  1 Day Post-Op  Procedure(s): LAPAROSCOPIC PARTIAL COLECTOMY;  Good so far  Plan:  -adv diet gradually per protocol -f/u path -anxiolysis -VTE prophylaxis- SCDs, etc -mobilize as tolerated to help recovery  Ardeth Sportsman, M.D., F.A.C.S. Gastrointestinal and Minimally Invasive Surgery [facility] [address] [place], [place] [address] [phone] Main / Paging   10/27/2012   Results:   Labs: Results for orders placed during the hospital encounter of 10/26/12 (from the past 48 hour(s))  CBC     Status: Abnormal   Collection Time    10/26/12  4:16 PM      Result Value Range   WBC 10.7 (*) 4.0 - 10.5 K/uL   RBC 3.66 (*) 3.87 - 5.11 MIL/uL   Hemoglobin 11.8 (*) 12.0 - 15.0 g/dL   HCT 86.5 (*) 78.4 - 69.6 %   MCV 93.2  78.0 - 100.0 fL   MCH 32.2  26.0 - 34.0 pg   MCHC 34.6  30.0 - 36.0 g/dL   RDW 29.5  28.4 - 13.2 %   Platelets 245  150 - 400 K/uL  CREATININE, SERUM     Status: None   Collection Time    10/26/12  4:16 PM      Result Value Range   Creatinine, Ser 0.55  0.50 - 1.10 mg/dL   GFR calc non Af Amer >90  >  90 mL/min   GFR calc Af Amer >90  >90 mL/min   Comment: (NOTE)     The eGFR has been calculated using the CKD EPI equation.     This calculation has not been validated in all clinical situations.     eGFR's persistently <90 mL/min signify possible Chronic Kidney     Disease.  BASIC METABOLIC PANEL     Status: Abnormal   Collection Time    10/27/12  4:23 AM      Result Value Range   Sodium 132 (*) 135 - 145 mEq/L   Potassium 4.3  3.5 - 5.1 mEq/L   Chloride 100  96 - 112 mEq/L   CO2 23  19 - 32 mEq/L   Glucose, Bld 141 (*) 70 - 99 mg/dL   BUN 5 (*) 6 - 23 mg/dL   Creatinine, Ser 4.54  0.50 - 1.10 mg/dL   Calcium 9.6  8.4 - 09.8 mg/dL   GFR calc non Af Amer >90  >90 mL/min   GFR calc Af Amer >90  >90 mL/min   Comment: (NOTE)     The eGFR has  been calculated using the CKD EPI equation.     This calculation has not been validated in all clinical situations.     eGFR's persistently <90 mL/min signify possible Chronic Kidney     Disease.  CBC     Status: Abnormal   Collection Time    10/27/12  4:23 AM      Result Value Range   WBC 11.1 (*) 4.0 - 10.5 K/uL   RBC 3.54 (*) 3.87 - 5.11 MIL/uL   Hemoglobin 11.6 (*) 12.0 - 15.0 g/dL   HCT 11.9 (*) 14.7 - 82.9 %   MCV 93.5  78.0 - 100.0 fL   MCH 32.8  26.0 - 34.0 pg   MCHC 35.0  30.0 - 36.0 g/dL   RDW 56.2  13.0 - 86.5 %   Platelets 228  150 - 400 K/uL  MAGNESIUM     Status: None   Collection Time    10/27/12  4:23 AM      Result Value Range   Magnesium 2.0  1.5 - 2.5 mg/dL    Imaging / Studies: No results found.  Medications / Allergies: per chart  Antibiotics: Anti-infectives   Start     Dose/Rate Route Frequency Ordered Stop   10/26/12 2200  cefoTEtan (CEFOTAN) 2 g in dextrose 5 % 50 mL IVPB     2 g 100 mL/hr over 30 Minutes Intravenous Every 12 hours 10/26/12 1705 10/26/12 2318   10/26/12 1400  clindamycin (CLEOCIN) 900 mg, gentamicin (GARAMYCIN) 240 mg in sodium chloride 0.9 % 1,000 mL for intraperitoneal lavage      Intraperitoneal To Surgery 10/26/12 1325 10/26/12 1516   10/26/12 1048  cefoTEtan (CEFOTAN) 2 g in dextrose 5 % 50 mL IVPB     2 g 100 mL/hr over 30 Minutes Intravenous On call to O.R. 10/26/12 1048 10/26/12 1346

## 2012-10-28 ENCOUNTER — Telehealth (INDEPENDENT_AMBULATORY_CARE_PROVIDER_SITE_OTHER): Payer: Self-pay

## 2012-10-28 MED ORDER — SODIUM CHLORIDE 0.9 % IJ SOLN
3.0000 mL | Freq: Two times a day (BID) | INTRAMUSCULAR | Status: DC
  Administered 2012-10-28: 3 mL via INTRAVENOUS

## 2012-10-28 MED ORDER — SODIUM CHLORIDE 0.9 % IJ SOLN: 3.0000 mL | INTRAMUSCULAR | Status: DC | PRN

## 2012-10-28 MED ORDER — LACTATED RINGERS IV BOLUS (SEPSIS): 1000.0000 mL | Freq: Three times a day (TID) | INTRAVENOUS | Status: DC | PRN

## 2012-10-28 NOTE — Telephone Encounter (Signed)
Called pt to notify her that her pathology report shows a precancerous polyp removed from the right colectomy NO CANCER per Dr Michaell Cowing. The pt made a f/u appt with Dr Michaell Cowing for 11/17/12.

## 2012-10-28 NOTE — Progress Notes (Signed)
Patient ready for d/c to home. D/C instructions given in presence of husband & daughter. Has On-Q pump still to abd and is to D/C at home tomorrow as she relayed per MD's instructions. Abd dsgs removed per MD. Patient showered this a.m. Sign/symptoms of infection discussed with patient and when to call MD. No c/o pain or discomfort. To bathroom to void prior leaving room.

## 2012-10-28 NOTE — Discharge Summary (Signed)
Physician Discharge Summary  Patient ID: Alyssa Reyes MRN: [id number] DOB/AGE: 03-01-1955 57 y.o.  Admit date: 10/26/2012 Discharge date: 10/28/2012  Admission Diagnoses:  Discharge Diagnoses:  Principal Problem:   Adenomatous colon polyp - large, in cecum Active Problems:   Anxiety state, unspecified   Tobacco abuse   Discharged Condition: fair  Hospital Course: Pt underwent lap colectomy for large cecal polyp.  Postoperatively, the patient mobilized in the hallways and advanced to a solid diet gradually.  Pain was well-controlled and transitioned off IV medications.    By the time of discharge, the patient was walking well the hallways, eating food well, having flatus.  Pain was-controlled on an oral regimen.  +BM.  Based on meeting DC criteria and recovering well, I felt it was safe for the patient to be discharged home with close followup.  Instructions were discussed in detail w pt & then later w her husband.  They are written as well.     Consults: None  Significant Diagnostic Studies:  Results for orders placed during the hospital encounter of 10/26/12 (from the past 72 hour(s))  CBC     Status: Abnormal   Collection Time    10/26/12  4:16 PM      Result Value Range   WBC 10.7 (*) 4.0 - 10.5 K/uL   RBC 3.66 (*) 3.87 - 5.11 MIL/uL   Hemoglobin 11.8 (*) 12.0 - 15.0 g/dL   HCT 40.9 (*) 81.1 - 91.4 %   MCV 93.2  78.0 - 100.0 fL   MCH 32.2  26.0 - 34.0 pg   MCHC 34.6  30.0 - 36.0 g/dL   RDW 78.2  95.6 - 21.3 %   Platelets 245  150 - 400 K/uL  CREATININE, SERUM     Status: None   Collection Time    10/26/12  4:16 PM      Result Value Range   Creatinine, Ser 0.55  0.50 - 1.10 mg/dL   GFR calc non Af Amer >90  >90 mL/min   GFR calc Af Amer >90  >90 mL/min   Comment: (NOTE)     The eGFR has been calculated using the CKD EPI equation.     This calculation has not been validated in all clinical situations.     eGFR's persistently <90 mL/min signify possible Chronic  Kidney     Disease.  BASIC METABOLIC PANEL     Status: Abnormal   Collection Time    10/27/12  4:23 AM      Result Value Range   Sodium 132 (*) 135 - 145 mEq/L   Potassium 4.3  3.5 - 5.1 mEq/L   Chloride 100  96 - 112 mEq/L   CO2 23  19 - 32 mEq/L   Glucose, Bld 141 (*) 70 - 99 mg/dL   BUN 5 (*) 6 - 23 mg/dL   Creatinine, Ser 0.86  0.50 - 1.10 mg/dL   Calcium 9.6  8.4 - 57.8 mg/dL   GFR calc non Af Amer >90  >90 mL/min   GFR calc Af Amer >90  >90 mL/min   Comment: (NOTE)     The eGFR has been calculated using the CKD EPI equation.     This calculation has not been validated in all clinical situations.     eGFR's persistently <90 mL/min signify possible Chronic Kidney     Disease.  CBC     Status: Abnormal   Collection Time    10/27/12  4:23 AM  Result Value Range   WBC 11.1 (*) 4.0 - 10.5 K/uL   RBC 3.54 (*) 3.87 - 5.11 MIL/uL   Hemoglobin 11.6 (*) 12.0 - 15.0 g/dL   HCT 82.9 (*) 56.2 - 13.0 %   MCV 93.5  78.0 - 100.0 fL   MCH 32.8  26.0 - 34.0 pg   MCHC 35.0  30.0 - 36.0 g/dL   RDW 86.5  78.4 - 69.6 %   Platelets 228  150 - 400 K/uL  MAGNESIUM     Status: None   Collection Time    10/27/12  4:23 AM      Result Value Range   Magnesium 2.0  1.5 - 2.5 mg/dL    Treatments:   POST-OPERATIVE DIAGNOSIS: large polyp of cecum   PROCEDURE: Procedure(s):  LAPAROSCOPIC PARTIAL COLECTOMY  SURGEON: Surgeon(s):  Ardeth Sportsman, MD  ASSISTANT: Bhavinkumar "Vin" Bhagat, PA student, [employer]      Discharge Exam: Blood pressure 107/66, pulse 68, temperature 98.7 F (37.1 C), temperature source Oral, resp. rate 17, height 5\' 3"  (1.6 m), weight 119 lb (53.978 kg), SpO2 94.00%.  General: Pt awake/alert/oriented x4 in no major acute distress Eyes: PERRL, normal EOM. Sclera nonicteric Neuro: CN II-XII intact w/o focal sensory/motor deficits. Lymph: No head/neck/groin lymphadenopathy Psych:  No delerium/psychosis/paranoia HENT: Normocephalic, Mucus membranes  moist.  No thrush Neck: Supple, No tracheal deviation Chest: No pain.  Good respiratory excursion. CV:  Pulses intact.  Regular rhythm MS: Normal AROM mjr joints.  No obvious deformity Abdomen: Soft, Nondistended.  Min tender.  No incarcerated hernias.  Incisions c/d/i Ext:  SCDs BLE.  No significant edema.  No cyanosis Skin: No petechiae / purpura   Disposition: Final discharge disposition not confirmed  Discharge Orders   Future Orders Complete By Expires   Call MD for:  extreme fatigue  As directed    Call MD for:  hives  As directed    Call MD for:  persistant nausea and vomiting  As directed    Call MD for:  redness, tenderness, or signs of infection (pain, swelling, redness, odor or green/yellow discharge around incision site)  As directed    Call MD for:  severe uncontrolled pain  As directed    Call MD for:  As directed    Comments:     Temperature > 101.67F   Diet - low sodium heart healthy  As directed    Discharge instructions  As directed    Comments:     Please see discharge instruction sheets.  Also refer to handout given an office.  Please call our office if you have any questions or concerns [phone]   Discharge wound care:  As directed    Comments:     If you have closed incisions, shower and bathe over these incisions with soap and water every day.  Remove all surgical dressings on postoperative day #3.  You do not need to replace dressings over the closed incisions unless you feel more comfortable with a Band-Aid covering it.   If you have an open wound that requires packing, please see wound care instructions.  In general, remove all dressings, wash wound with soap and water and then replace with saline moistened gauze.  Do the dressing change at least every day.  Please call our office [phone] if you have further questions.   Driving Restrictions  As directed    Comments:     No driving until off narcotics and can safely swerve  away without pain during  an emergency   Increase activity slowly  As directed    Comments:     Walk an hour a day.  Use 20-30 minute walks.  When you can walk 30 minutes without difficulty, increase to low impact/moderate activities such as biking, jogging, swimming, sexual activity..  Eventually can increase to unrestricted activity when not feeling pain.  If you feel pain: STOP!Marland Kitchen   Let pain protect you from overdoing it.  Use ice/heat/over-the-counter pain medications to help minimize his soreness.  Use pain prescriptions as needed to remain active.  It is better to take extra pain medications and be more active than to stay bedridden to avoid all pain medications.   Lifting restrictions  As directed    Comments:     Avoid heavy lifting initially.  Do not push through pain.  You have no specific weight limit.  Coughing and sneezing or four more stressful to your incision than any lifting you will do. Pain will protect you from injury.  Therefore, avoid intense activity until off all narcotic pain medications.  Coughing and sneezing or four more stressful to your incision than any lifting he will do.   May shower / Bathe  As directed    May walk up steps  As directed    Sexual Activity Restrictions  As directed    Comments:     Sexual activity as tolerated.  Do not push through pain.  Pain will protect you from injury.   Walk with assistance  As directed    Comments:     Walk over an hour a day.  May use a walker/cane/companion to help with balance and stamina.       Medication List         ALPRAZolam 1 MG tablet  Commonly known as:  XANAX  Take 1 mg by mouth at bedtime as needed for sleep.     escitalopram 10 MG tablet  Commonly known as:  LEXAPRO  Take 10 mg by mouth every evening.     fish oil-omega-3 fatty acids 1000 MG capsule  Take 1 g by mouth daily.     multivitamin with minerals tablet  Take 1 tablet by mouth daily.     oxyCODONE 5 MG immediate release tablet  Commonly known as:  Oxy  IR/ROXICODONE  Take 1-2 tablets (5-10 mg total) by mouth every 4 (four) hours as needed for pain.           Follow-up Information   Follow up with Emie Sommerfeld C., MD. Schedule an appointment as soon as possible for a visit in 2 weeks.   Specialty:  General Surgery   Contact information:   [address] [place] [address] [phone]       Signed: Ardeth Sportsman. 10/28/2012, 1:57 PM

## 2012-11-10 ENCOUNTER — Other Ambulatory Visit: Payer: Self-pay

## 2012-11-17 ENCOUNTER — Ambulatory Visit (INDEPENDENT_AMBULATORY_CARE_PROVIDER_SITE_OTHER): Payer: BC Managed Care – PPO | Admitting: Surgery

## 2012-11-17 ENCOUNTER — Encounter (INDEPENDENT_AMBULATORY_CARE_PROVIDER_SITE_OTHER): Payer: Self-pay | Admitting: Surgery

## 2012-11-17 VITALS — BP 122/84 | HR 76 | Temp 97.3°F | Resp 16 | Ht 63.0 in | Wt 113.0 lb

## 2012-11-17 DIAGNOSIS — D126 Benign neoplasm of colon, unspecified: Secondary | ICD-10-CM

## 2012-11-17 NOTE — Progress Notes (Signed)
Subjective:     Patient ID: Alyssa Reyes, female   DOB: 30-Aug-1955, 57 y.o.   MRN: [id number]  HPI  Alyssa Reyes  1955/11/21 [id number]  Patient Care Team: Ernestina Penna, MD as PCP - General (Family Medicine) Louis Meckel, MD as Consulting Physician (Gastroenterology)  Procedure (Date: 10/26/2012):  POST-OPERATIVE DIAGNOSIS: large polyp of cecum   PROCEDURE: Procedure(s):  LAPAROSCOPIC PARTIAL COLECTOMY  SURGEON: Surgeon(s):  Ardeth Sportsman, MD  Diagnosis Colon, segmental resection for tumor, proximal - A LARGE TUBULAR ADENOMA, 3.7 CM; NO EVIDENCE OF HIGH GRADE DYSPLASIA OR MALIGNANCY. - THIRTEEN LYMPH NODES, NEGATIVE FOR NEOPLASM (0/13). - RESECTION MARGINS NEGATIVE FOR ATYPIA OR MALIGNANCY. Abigail Miyamoto MD Pathologist, Electronic Signature (Case signed 10/28/2012)  This patient returns for surgical re-evaluation.  She comes today with her husband.  She is in good spirits.  Eating well.  Appetite better.  Some mild hyper flatulence but improving.  No fevers or chills.  Soreness fading away.  Some occasional muscle pulling but it is calming down overall.  She is walking well.  She wants to get back to work, but that involves ten-hour shifts 6 days a week with high intense activity.  She is concerned about going back to work too early.  Wished me to reexplain what I did to her, so I explained the surgery and operative findings.  Copy of pathology given to her.  Patient Active Problem List   Diagnosis Date Noted  . Adenomatous colon polyp - large, in cecum 10/10/2012  . Tobacco abuse 10/10/2012  . Nonspecific abnormal finding in stool contents 08/15/2012  . Anxiety state, unspecified 06/22/2012  . Allergic rhinitis 06/22/2012    Past Medical History  Diagnosis Date  . Migraines   . Anxiety   . Depression   . Colon polyp     IN CECUM - FOUND ON ROUTINE COLONOSCPY  . Cough     PT STATES SINUS DRAINAGE AND PT IS A SMOKER X 20    Past Surgical History   Procedure Laterality Date  . Abdominal hysterectomy  1980s  . Breast surgery Bilateral     "Knot" removal   . Appendectomy  1980S  . Laparoscopic partial colectomy N/A 10/26/2012    Procedure: LAPAROSCOPIC PARTIAL COLECTOMY;;  Surgeon: Ardeth Sportsman, MD;  Location: [facility];  Service: General;  Laterality: N/A;    History   Social History  . Marital Status: Married    Spouse Name: N/A    Number of Children: 2  . 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 Smoking status: Current Every Day Smoker -- 1.00 packs/day for 20 years    Types: Cigarettes  . Smokeless tobacco: Never Used  . Alcohol Use: 0.6 oz/week    1 Cans of beer per week     Comment: on occasions  . Drug Use: No  . Sexual Activity: Not on file   Other Topics Concern  . Not on file   Social History Narrative  . No narrative on file    Family History  Problem Relation Age of Onset  . Diabetes Mother   . Hyperlipidemia Mother   . Hypertension Mother   . Heart disease Mother   . Stroke Father   . Heart disease Father   . Diabetes Sister   . Hypertension Sister   . Diabetes Brother   . Heart disease Brother   . Hyperlipidemia  Brother   . Hypertension Brother     Current Outpatient Prescriptions  Medication Sig Dispense Refill  . ALPRAZolam (XANAX) 1 MG tablet Take 1 mg by mouth at bedtime as needed for sleep.      Marland Kitchen escitalopram (LEXAPRO) 10 MG tablet Take 10 mg by mouth every evening.      . fish oil-omega-3 fatty acids 1000 MG capsule Take 1 g by mouth daily.       . Multiple Vitamins-Minerals (MULTIVITAMIN WITH MINERALS) tablet Take 1 tablet by mouth daily.      . polycarbophil (FIBERCON) 625 MG tablet Take 625 mg by mouth daily.       No current facility-administered medications for this visit.     Allergies  Allergen Reactions  . Codeine Nausea And Vomiting  . Hydrocodone Nausea And Vomiting  . Erythromycin Nausea And  Vomiting  . Tramadol Itching    BP 122/84  Pulse 76  Temp(Src) 97.3 F (36.3 C) (Temporal)  Resp 16  Ht 5\' 3"  (1.6 m)  Wt 113 lb (51.256 kg)  BMI 20.02 kg/m2  Dg Chest 2 View  10/24/2012   CLINICAL DATA:  Preop colon resection  EXAM: CHEST  2 VIEW  COMPARISON:  None.  FINDINGS: The heart size and mediastinal contours are within normal limits. Both lungs are clear. The visualized skeletal structures are unremarkable.  IMPRESSION: No active cardiopulmonary disease.   Electronically Signed   By: Genevive Bi M.D.   On: 10/24/2012 15:14     Review of Systems  Constitutional: Negative for fever, chills and diaphoresis.  HENT: Negative for ear pain, sore throat and trouble swallowing.   Eyes: Negative for photophobia and visual disturbance.  Respiratory: Negative for cough and choking.   Cardiovascular: Negative for chest pain and palpitations.  Gastrointestinal: Negative for nausea, vomiting, abdominal pain, diarrhea, constipation, anal bleeding and rectal pain.  Genitourinary: Negative for dysuria, frequency and difficulty urinating.  Musculoskeletal: Negative for gait problem and myalgias.  Skin: Negative for color change, pallor and rash.  Neurological: Negative for dizziness, speech difficulty, weakness and numbness.  Hematological: Negative for adenopathy.  Psychiatric/Behavioral: Negative for confusion and agitation. The patient is not nervous/anxious.        Objective:   Physical Exam  Constitutional: She is oriented to person, place, and time. She appears well-developed and well-nourished. No distress.  HENT:  Head: Normocephalic.  Mouth/Throat: Oropharynx is clear and moist. No oropharyngeal exudate.  Eyes: Conjunctivae and EOM are normal. Pupils are equal, round, and reactive to light. No scleral icterus.  Neck: Normal range of motion. No tracheal deviation present.  Cardiovascular: Normal rate and intact distal pulses.   Pulmonary/Chest: Effort normal. No  respiratory distress. She exhibits no tenderness.  Abdominal: Soft. She exhibits no distension. There is no tenderness. Hernia confirmed negative in the right inguinal area and confirmed negative in the left inguinal area.  Incisions clean with normal healing ridges.  No hernias  Genitourinary: No vaginal discharge found.  Musculoskeletal: Normal range of motion. She exhibits no tenderness.  Lymphadenopathy:       Right: No inguinal adenopathy present.       Left: No inguinal adenopathy present.  Neurological: She is alert and oriented to person, place, and time. No cranial nerve deficit. She exhibits normal muscle tone. Coordination normal.  Skin: Skin is warm and dry. No rash noted. She is not diaphoretic.  Psychiatric: She has a normal mood and affect. Her behavior is normal.  Assessment:    Recovering well 3 weeks status post laparoscopic proximal right colectomy for removal of large cecal polyp     Plan:     Increase activity as tolerated to regular activity.  Low impact exercise such as walking an hour a day at least ideal.  Do not push through pain.  Diet as tolerated.  Low fat high fiber diet ideal.  Bowel regimen with 30 g fiber a day and fiber supplement as needed to avoid problems.  followup colonoscopy in one year.  I defer to Gastroenterology on this.  Return to clinic as needed.   Instructions discussed.  Followup with primary care physician for other health issues as would normally be done.  Questions answered.  The patient expressed understanding and appreciation

## 2012-11-17 NOTE — Patient Instructions (Signed)
ABDOMINAL SURGERY: POST OP INSTRUCTIONS  1. DIET: Follow a light bland diet the first 24 hours after arrival home, such as soup, liquids, crackers, etc.  Be sure to include lots of fluids daily.  Avoid fast food or heavy meals as your are more likely to get nauseated.  Eat a low fat the next few days after surgery.   2. Take your usually prescribed home medications unless otherwise directed. 3. PAIN CONTROL: a. Pain is best controlled by a usual combination of three different methods TOGETHER: i. Ice/Heat ii. Over the counter pain medication iii. Prescription pain medication b. Most patients will experience some swelling and bruising around the incisions.  Ice packs or heating pads (30-60 minutes up to 6 times a day) will help. Use ice for the first few days to help decrease swelling and bruising, then switch to heat to help relax tight/sore spots and speed recovery.  Some people prefer to use ice alone, heat alone, alternating between ice & heat.  Experiment to what works for you.  Swelling and bruising can take several weeks to resolve.   c. It is helpful to take an over-the-counter pain medication regularly for the first few weeks.  Choose one of the following that works best for you: i. Naproxen (Aleve, etc)  Two 262m tabs twice a day ii. Ibuprofen (Advil, etc) Three 2066mtabs four times a day (every meal & bedtime) iii. Acetaminophen (Tylenol, etc) 500-65011mour times a day (every meal & bedtime) d. A  prescription for pain medication (such as oxycodone, hydrocodone, etc) should be given to you upon discharge.  Take your pain medication as prescribed.  i. If you are having problems/concerns with the prescription medicine (does not control pain, nausea, vomiting, rash, itching, etc), please call us [phone] see if we need to switch you to a different pain medicine that will work better for you and/or control your side effect better. ii. If you need a refill on your pain medication,  please contact your pharmacy.  They will contact our office to request authorization. Prescriptions will not be filled after 5 pm or on week-ends. 4. Avoid getting constipated.  Between the surgery and the pain medications, it is common to experience some constipation.  Increasing fluid intake and taking a fiber supplement (such as Metamucil, Citrucel, FiberCon, MiraLax, etc) 1-2 times a day regularly will usually help prevent this problem from occurring.  A mild laxative (prune juice, Milk of Magnesia, MiraLax, etc) should be taken according to package directions if there are no bowel movements after 48 hours.   5. Watch out for diarrhea.  If you have many loose bowel movements, simplify your diet to bland foods & liquids for a few days.  Stop any stool softeners and decrease your fiber supplement.  Switching to mild anti-diarrheal medications (Kayopectate, Pepto Bismol) can help.  If this worsens or does not improve, please call us.[place]. Wash / shower every day.  You may shower over the incision / wound.  Avoid baths until the skin is fully healed.  Continue to shower over incision(s) after the dressing is off. 7. Remove your waterproof bandages 5 days after surgery.  You may leave the incision open to air.  You may replace a dressing/Band-Aid to cover the incision for comfort if you wish. 8. ACTIVITIES as tolerated:   a. You may resume regular (light) daily activities beginning the next day-such as daily self-care, walking, climbing stairs-gradually increasing activities as tolerated.  If you can  walk 30 minutes without difficulty, it is safe to try more intense activity such as jogging, treadmill, bicycling, low-impact aerobics, swimming, etc. b. Save the most intensive and strenuous activity for last such as sit-ups, heavy lifting, contact sports, etc  Refrain from any heavy lifting or straining until you are off narcotics for pain control.   c. DO NOT PUSH THROUGH PAIN.  Let pain be your guide: If it  hurts to do something, don't do it.  Pain is your body warning you to avoid that activity for another week until the pain goes down. d. You may drive when you are no longer taking prescription pain medication, you can comfortably wear a seatbelt, and you can safely maneuver your car and apply brakes. e. Dennis Bast may have sexual intercourse when it is comfortable.  9. FOLLOW UP in our office a. Please call [facility] at [phone] to set up an appointment to see your surgeon in the office for a follow-up appointment approximately 1-2 weeks after your surgery. b. Make sure that you call for this appointment the day you arrive home to insure a convenient appointment time. 10. IF YOU HAVE DISABILITY OR FAMILY LEAVE FORMS, BRING THEM TO THE OFFICE FOR PROCESSING.  DO NOT GIVE THEM TO YOUR DOCTOR.   WHEN TO CALL us [phone]: 1. Poor pain control 2. Reactions / problems with new medications (rash/itching, nausea, etc)  3. Fever over 101.5 F (38.5 C) 4. Inability to urinate 5. Nausea and/or vomiting 6. Worsening swelling or bruising 7. Continued bleeding from incision. 8. Increased pain, redness, or drainage from the incision  The clinic staff is available to answer your questions during regular business hours (8:30am-5pm).  Please don't hesitate to call and ask to speak to one of our nurses for clinical concerns.   A surgeon from [facility] is always on call at the hospitals   If you have a medical emergency, go to the nearest emergency room or call 911.    [facility], [address], [place], [place], [place]  [address] ? MAIN: [phone] ? TOLL FREE: [phone] ? FAX [phone] [email or web address]  GETTING TO GOOD BOWEL HEALTH. Irregular bowel habits such as constipation and diarrhea can lead to many problems over time.  Having one soft bowel movement a day is the most important way to prevent further problems.  The anorectal canal  is designed to handle stretching and feces to safely manage our ability to get rid of solid waste (feces, poop, stool) out of our body.  BUT, hard constipated stools can act like ripping concrete bricks and diarrhea can be a burning fire to this very sensitive area of our body, causing inflamed hemorrhoids, anal fissures, increasing risk is perirectal abscesses, abdominal pain/bloating, an making irritable bowel worse.     The goal: ONE SOFT BOWEL MOVEMENT A DAY!  To have soft, regular bowel movements:    Drink at least 8 tall glasses of water a day.     Take plenty of fiber.  Fiber is the undigested part of plant food that passes into the colon, acting s "natures broom" to encourage bowel motility and movement.  Fiber can absorb and hold large amounts of water. This results in a larger, bulkier stool, which is soft and easier to pass. Work gradually over several weeks up to 6 servings a day of fiber (25g a day even more if needed) in the form of: o Vegetables -- Root (potatoes, carrots, turnips),  leafy green (lettuce, salad greens, celery, spinach), or cooked high residue (cabbage, broccoli, etc) o Fruit -- Fresh (unpeeled skin & pulp), Dried (prunes, apricots, cherries, etc ),  or stewed ( applesauce)  o Whole grain breads, pasta, etc (whole wheat)  o Bran cereals    Bulking Agents -- This type of water-retaining fiber generally is easily obtained each day by one of the following:  o Psyllium bran -- The psyllium plant is remarkable because its ground seeds can retain so much water. This product is available as Metamucil, Konsyl, Effersyllium, Per Diem Fiber, or the less expensive generic preparation in drug and health food stores. Although labeled a laxative, it really is not a laxative.  o Methylcellulose -- This is another fiber derived from wood which also retains water. It is available as Citrucel. o Polyethylene Glycol - and "artificial" fiber commonly called Miralax or Glycolax.  It is helpful  for people with gassy or bloated feelings with regular fiber o Flax Seed - a less gassy fiber than psyllium   No reading or other relaxing activity while on the toilet. If bowel movements take longer than 5 minutes, you are too constipated   AVOID CONSTIPATION.  High fiber and water intake usually takes care of this.  Sometimes a laxative is needed to stimulate more frequent bowel movements, but    Laxatives are not a good long-term solution as it can wear the colon out. o Osmotics (Milk of Magnesia, Fleets phosphosoda, Magnesium citrate, MiraLax, GoLytely) are safer than  o Stimulants (Senokot, Castor Oil, Dulcolax, Ex Lax)    o Do not take laxatives for more than 7days in a row.    IF SEVERELY CONSTIPATED, try a Bowel Retraining Program: o Do not use laxatives.  o Eat a diet high in roughage, such as bran cereals and leafy vegetables.  o Drink six (6) ounces of prune or apricot juice each morning.  o Eat two (2) large servings of stewed fruit each day.  o Take one (1) heaping tablespoon of a psyllium-based bulking agent twice a day. Use sugar-free sweetener when possible to avoid excessive calories.  o Eat a normal breakfast.  o Set aside 15 minutes after breakfast to sit on the toilet, but do not strain to have a bowel movement.  o If you do not have a bowel movement by the third day, use an enema and repeat the above steps.    Controlling diarrhea o Switch to liquids and simpler foods for a few days to avoid stressing your intestines further. o Avoid dairy products (especially milk & ice cream) for a short time.  The intestines often can lose the ability to digest lactose when stressed. o Avoid foods that cause gassiness or bloating.  Typical foods include beans and other legumes, cabbage, broccoli, and dairy foods.  Every person has some sensitivity to other foods, so listen to our body and avoid those foods that trigger problems for you. o Adding fiber (Citrucel, Metamucil, psyllium,  Miralax) gradually can help thicken stools by absorbing excess fluid and retrain the intestines to act more normally.  Slowly increase the dose over a few weeks.  Too much fiber too soon can backfire and cause cramping & bloating. o Probiotics (such as active yogurt, Align, etc) may help repopulate the intestines and colon with normal bacteria and calm down a sensitive digestive tract.  Most studies show it to be of mild help, though, and such products can be costly. o Medicines:   Bismuth  subsalicylate (ex. Kayopectate, Pepto Bismol) every 30 minutes for up to 6 doses can help control diarrhea.  Avoid if pregnant.   Loperamide (Immodium) can slow down diarrhea.  Start with two tablets (4mg  total) first and then try one tablet every 6 hours.  Avoid if you are having fevers or severe pain.  If you are not better or start feeling worse, stop all medicines and call your doctor for advice o Call your doctor if you are getting worse or not better.  Sometimes further testing (cultures, endoscopy, X-ray studies, bloodwork, etc) may be needed to help diagnose and treat the cause of the diarrhea.  STOP SMOKING!  We strongly recommend that you stop smoking.  Smoking increases the risk of surgery including infection in the form of an open wound, pus formation, abscess, hernia at an incision on the abdomen, etc.  You have an increased risk of other MAJOR complications such as stroke, heart attack, forming clots in the leg and/or lungs, and death.    While it can be one of the most difficult things to do, the [place] community has programs to help you stop.  Consider talking with your primary care physician about options.  Also, Smoking Cessation classes are available through the [facility]:  The smoking cessation program is a proven-effective program from the American Lung Association. The program is available for anyone 14 and older who currently smokes. The program lasts for 7 weeks and is 8 sessions. Each class  will be approximately 1 1/2 hours. The program is every Tuesday.  All classes are 12-1:30pm and same location.  Event Location Information:  Location: [facility] 2nd Floor Conference Room 2-037; located next to [facility] cross streets: [facility]ys Damme & [facility] Entrance into the [facility] is adjacent to the [employer] main entrance. The conference room is located on the 2nd floor.  Parking Instructions: Visitor parking is adjacent to [employer] main entrance and the [employer] [phone] or check the Classes and Support Groups   [email or web address] the event of inclemet weather please call [phone] or view online at [email or web address]  Smoking Cessation, Tips for Success YOU CAN QUIT SMOKING If you are ready to quit smoking, congratulations! You have chosen to help yourself be healthier. Cigarettes bring nicotine, tar, carbon monoxide, and other irritants into your body. Your lungs, heart, and blood vessels will be able to work better without these poisons. There are many different ways to quit smoking. Nicotine gum, nicotine patches, a nicotine inhaler, or nicotine nasal spray can help with physical craving. Hypnosis, support groups, and medicines help break the habit of smoking. Here are some tips to help you quit for good.  Throw away all cigarettes.  Clean and remove all ashtrays from your home, work, and car.  On a card, write down your reasons for quitting. Carry the card with you and read it when you get the urge to smoke.  Cleanse your body of nicotine. Drink enough water and fluids to keep your urine clear or pale yellow. Do this after quitting to flush the nicotine from your body.  Learn to predict your moods. Do not let a bad situation be your excuse to have a cigarette. Some situations in your life might tempt you into wanting a cigarette.  Never  have "just one" cigarette. It leads to wanting another and another. Remind yourself of your decision to quit.  Change habits  associated with smoking. If you smoked while driving or when feeling stressed, try other activities to replace smoking. Stand up when drinking your coffee. Brush your teeth after eating. Sit in a different chair when you read the paper. Avoid alcohol while trying to quit, and try to drink fewer caffeinated beverages. Alcohol and caffeine may urge you to smoke.  Avoid foods and drinks that can trigger a desire to smoke, such as sugary or spicy foods and alcohol.  Ask people who smoke not to smoke around you.  Have something planned to do right after eating or having a cup of coffee. Take a walk or exercise to perk you up. This will help to keep you from overeating.  Try a relaxation exercise to calm you down and decrease your stress. Remember, you may be tense and nervous for the first 2 weeks after you quit, but this will pass.  Find new activities to keep your hands busy. Play with a pen, coin, or rubber band. Doodle or draw things on paper.  Brush your teeth right after eating. This will help cut down on the craving for the taste of tobacco after meals. You can try mouthwash, too.  Use oral substitutes, such as lemon drops, carrots, a cinnamon stick, or chewing gum, in place of cigarettes. Keep them handy so they are available when you have the urge to smoke.  When you have the urge to smoke, try deep breathing.  Designate your home as a nonsmoking area.  If you are a heavy smoker, ask your caregiver about a prescription for nicotine chewing gum. It can ease your withdrawal from nicotine.  Reward yourself. Set aside the cigarette money you save and buy yourself something nice.  Look for support from others. Join a support group or smoking cessation program. Ask someone at home or at work to help you with your plan to quit smoking.  Always ask yourself, "Do I need  this cigarette or is this just a reflex?" Tell yourself, "Today, I choose not to smoke," or "I do not want to smoke." You are reminding yourself of your decision to quit, even if you do smoke a cigarette. HOW WILL I FEEL WHEN I QUIT SMOKING?  The benefits of not smoking start within days of quitting.  You may have symptoms of withdrawal because your body is used to nicotine (the addictive substance in cigarettes). You may crave cigarettes, be irritable, feel very hungry, cough often, get headaches, or have difficulty concentrating.  The withdrawal symptoms are only temporary. They are strongest when you first quit but will go away within 10 to 14 days.  When withdrawal symptoms occur, stay in control. Think about your reasons for quitting. Remind yourself that these are signs that your body is healing and getting used to being without cigarettes.  Remember that withdrawal symptoms are easier to treat than the major diseases that smoking can cause.  Even after the withdrawal is over, expect periodic urges to smoke. However, these cravings are generally short-lived and will go away whether you smoke or not. Do not smoke!  If you relapse and smoke again, do not lose hope. Most smokers quit 3 times before they are successful.  If you relapse, do not give up! Plan ahead and think about what you will do the next time you get the urge to smoke. LIFE AS A NONSMOKER: MAKE IT FOR A MONTH, MAKE IT FOR LIFE Day 1: Hang this page where you will see it every day.  Day 2: Get rid of all ashtrays, matches, and lighters. Day 3: Drink water. Breathe deeply between sips. Day 4: Avoid places with smoke-filled air, such as bars, clubs, or the smoking section of restaurants. Day 5: Keep track of how much money you save by not smoking. Day 6: Avoid boredom. Keep a good book with you or go to the movies. Day 7: Reward yourself! One week without smoking! Day 8: Make a dental appointment to get your teeth  cleaned. Day 9: Decide how you will turn down a cigarette before it is offered to you. Day 10: Review your reasons for quitting. Day 11: Distract yourself. Stay active to keep your mind off smoking and to relieve tension. Take a walk, exercise, read a book, do a crossword puzzle, or try a new hobby. Day 12: Exercise. Get off the bus before your stop or use stairs instead of escalators. Day 13: Call on friends for support and encouragement. Day 14: Reward yourself! Two weeks without smoking! Day 15: Practice deep breathing exercises. Day 16: Bet a friend that you can stay a nonsmoker. Day 17: Ask to sit in nonsmoking sections of restaurants. Day 18: Hang up "No Smoking" signs. Day 19: Think of yourself as a nonsmoker. Day 20: Each morning, tell yourself you will not smoke. Day 21: Reward yourself! Three weeks without smoking! Day 22: Think of smoking in negative ways. Remember how it stains your teeth, gives you bad breath, and leaves you short of breath. Day 23: Eat a nutritious breakfast. Day 24:Do not relive your days as a smoker. Day 25: Hold a pencil in your hand when talking on the telephone. Day 26: Tell all your friends you do not smoke. Day 27: Think about how much better food tastes. Day 28: Remember, one cigarette is one too many. Day 29: Take up a hobby that will keep your hands busy. Day 30: Congratulations! One month without smoking! Give yourself a big reward. Your caregiver can direct you to community resources or hospitals for support, which may include:  Group support.  Education.  Hypnosis.  Subliminal therapy. Document Released: 09/20/2003 Document Revised: 03/16/2011 Document Reviewed: 06/09/2012 [facility] Patient Information 2014 [place], [place].

## 2012-11-29 ENCOUNTER — Telehealth (INDEPENDENT_AMBULATORY_CARE_PROVIDER_SITE_OTHER): Payer: Self-pay

## 2012-11-29 ENCOUNTER — Encounter (INDEPENDENT_AMBULATORY_CARE_PROVIDER_SITE_OTHER): Payer: Self-pay

## 2012-11-29 NOTE — Telephone Encounter (Signed)
Pt calling in stating that one of her stitches is "popping out". Advised that she may come in for a nurse only visit to have the site evaluated.  Pt declined and asked if she could "get it out" herself.  I advised against this.  Pt stated that it is not bothering her.  Stated that she hopes it "works itself out" and that if it becomes bothersome she will call to schedule a nurse only visit.

## 2012-12-30 ENCOUNTER — Other Ambulatory Visit: Payer: Self-pay | Admitting: Physician Assistant

## 2013-01-02 ENCOUNTER — Other Ambulatory Visit: Payer: Self-pay

## 2013-01-02 NOTE — Telephone Encounter (Signed)
Last seen 09/29/12  B [place]  If approved route to nurse to call into [facility]

## 2013-01-02 NOTE — Telephone Encounter (Signed)
Last seen 09/29/12  B [place]  If approved route to nurse to call into [facility] 

## 2013-01-03 ENCOUNTER — Other Ambulatory Visit: Payer: Self-pay | Admitting: Family Medicine

## 2013-01-03 NOTE — Telephone Encounter (Signed)
Need to follow up.

## 2013-01-09 ENCOUNTER — Encounter: Payer: Self-pay | Admitting: General Practice

## 2013-01-09 ENCOUNTER — Ambulatory Visit (INDEPENDENT_AMBULATORY_CARE_PROVIDER_SITE_OTHER): Payer: BC Managed Care – PPO | Admitting: General Practice

## 2013-01-09 VITALS — BP 155/74 | HR 71 | Temp 98.1°F | Ht 63.0 in | Wt 120.0 lb

## 2013-01-09 DIAGNOSIS — F411 Generalized anxiety disorder: Secondary | ICD-10-CM

## 2013-01-09 DIAGNOSIS — G4700 Insomnia, unspecified: Secondary | ICD-10-CM

## 2013-01-09 MED ORDER — ESCITALOPRAM OXALATE 10 MG PO TABS: 10.0000 mg | ORAL_TABLET | Freq: Every evening | ORAL | Status: AC

## 2013-01-09 MED ORDER — ALPRAZOLAM 1 MG PO TABS: 1.0000 mg | ORAL_TABLET | Freq: Every evening | ORAL | Status: DC | PRN

## 2013-01-09 NOTE — Progress Notes (Signed)
   Subjective:    Patient ID: Alyssa Reyes, female    DOB: Jan 25, 1955, 58 y.o.   MRN: [id number]  HPI Patient presents today for chronic health follow up. History of anxiety and insomnia. Repots taking medications prescribed (lexapro and xanax) and they are effective. Anxiety is well controlled and sleeping 6-8 hours a night.     Review of Systems  Constitutional: Negative for fever and chills.  Respiratory: Negative for chest tightness and shortness of breath.   Cardiovascular: Negative for chest pain and palpitations.  Psychiatric/Behavioral: Negative for suicidal ideas, sleep disturbance and self-injury. The patient is not nervous/anxious.        Objective:   Physical Exam  Constitutional: She is oriented to person, place, and time. She appears well-developed and well-nourished.  HENT:  Head: Normocephalic and atraumatic.  Right Ear: External ear normal.  Left Ear: External ear normal.  Eyes: Pupils are equal, round, and reactive to light.  Neck: Normal range of motion. Neck supple. No thyromegaly present.  Cardiovascular: Normal rate, regular rhythm and normal heart sounds.   Pulmonary/Chest: Effort normal and breath sounds normal. No respiratory distress. She exhibits no tenderness.  Abdominal: Soft. Bowel sounds are normal. She exhibits no distension. There is no tenderness.  Lymphadenopathy:    She has no cervical adenopathy.  Neurological: She is alert and oriented to person, place, and time.  Skin: Skin is warm and dry.  Psychiatric: She has a normal mood and affect.          Assessment & Plan:  1. Generalized anxiety disorder  - escitalopram (LEXAPRO) 10 MG tablet; Take 1 tablet (10 mg total) by mouth every evening.  Dispense: 30 tablet; Refill: 5  2. Insomnia  - ALPRAZolam (XANAX) 1 MG tablet; Take 1 tablet (1 mg total) by mouth at bedtime as needed for sleep.  Dispense: 30 tablet; Refill: 1 -patient education provided and discussed (insomnia) -discussed  starting a different sleep aid, trazodone (patient willing to try with next refill) -Patient verbalized understanding Erby Pian, FNP-C

## 2013-01-09 NOTE — Patient Instructions (Signed)

## 2013-03-21 ENCOUNTER — Other Ambulatory Visit: Payer: Self-pay

## 2013-03-21 DIAGNOSIS — G4700 Insomnia, unspecified: Secondary | ICD-10-CM

## 2013-03-21 NOTE — Telephone Encounter (Signed)
Last seen 01/09/13  Alyssa Reyes  If approved route to nurse to call into [facility]

## 2013-03-23 MED ORDER — ALPRAZOLAM 1 MG PO TABS: 1.0000 mg | ORAL_TABLET | Freq: Every evening | ORAL | Status: DC | PRN

## 2013-06-25 ENCOUNTER — Other Ambulatory Visit: Payer: Self-pay | Admitting: Physician Assistant

## 2013-10-10 ENCOUNTER — Telehealth: Payer: Self-pay | Admitting: Gastroenterology

## 2013-10-10 ENCOUNTER — Other Ambulatory Visit: Payer: Self-pay

## 2013-10-10 DIAGNOSIS — Z86010 Personal history of colonic polyps: Secondary | ICD-10-CM

## 2013-10-10 NOTE — Telephone Encounter (Signed)
Procedure scheduled for her convince. Leter with appointment information mailed to the patient

## 2013-10-16 ENCOUNTER — Encounter: Payer: Self-pay | Admitting: Gastroenterology

## 2013-10-24 ENCOUNTER — Encounter (HOSPITAL_COMMUNITY): Payer: Self-pay | Admitting: Pharmacy Technician

## 2013-11-02 ENCOUNTER — Encounter (HOSPITAL_COMMUNITY): Payer: Self-pay | Admitting: *Deleted

## 2013-11-06 ENCOUNTER — Ambulatory Visit (AMBULATORY_SURGERY_CENTER): Payer: Self-pay | Admitting: *Deleted

## 2013-11-06 VITALS — Ht 63.0 in | Wt 121.6 lb

## 2013-11-06 DIAGNOSIS — Z86010 Personal history of colonic polyps: Secondary | ICD-10-CM

## 2013-11-06 MED ORDER — NA SULFATE-K SULFATE-MG SULF 17.5-3.13-1.6 GM/177ML PO SOLN: 1.0000 | Freq: Once | ORAL | Status: AC

## 2013-11-06 NOTE — Progress Notes (Signed)
No eggs or soy allergy. ewm No home 02 use, no diet pills, no blood thinners. ewm No issues with past sedation. ewm Pt declined emmi. ewm

## 2013-11-07 ENCOUNTER — Encounter: Payer: Self-pay | Admitting: General Practice

## 2013-11-24 ENCOUNTER — Ambulatory Visit (HOSPITAL_COMMUNITY): Payer: BC Managed Care – PPO | Admitting: Anesthesiology

## 2013-11-24 ENCOUNTER — Encounter (HOSPITAL_COMMUNITY): Payer: Self-pay | Admitting: *Deleted

## 2013-11-24 ENCOUNTER — Encounter (HOSPITAL_COMMUNITY)
Admission: RE | Disposition: A | Discharge status: Home / Self Care | Payer: Self-pay | Source: Ambulatory Visit | Attending: Gastroenterology

## 2013-11-24 ENCOUNTER — Ambulatory Visit (HOSPITAL_COMMUNITY)
Admission: RE | Admit: 2013-11-24 | Discharge: 2013-11-24 | Disposition: A | Discharge status: Home / Self Care | Payer: BC Managed Care – PPO | Source: Ambulatory Visit | Attending: Gastroenterology | Admitting: Gastroenterology

## 2013-11-24 DIAGNOSIS — Z860100 Personal history of colon polyps, unspecified: Secondary | ICD-10-CM

## 2013-11-24 DIAGNOSIS — Z09 Encounter for follow-up examination after completed treatment for conditions other than malignant neoplasm: Secondary | ICD-10-CM | POA: Diagnosis present

## 2013-11-24 DIAGNOSIS — F329 Major depressive disorder, single episode, unspecified: Secondary | ICD-10-CM | POA: Diagnosis not present

## 2013-11-24 DIAGNOSIS — F419 Anxiety disorder, unspecified: Secondary | ICD-10-CM | POA: Diagnosis not present

## 2013-11-24 DIAGNOSIS — Z9049 Acquired absence of other specified parts of digestive tract: Secondary | ICD-10-CM | POA: Diagnosis not present

## 2013-11-24 DIAGNOSIS — G43909 Migraine, unspecified, not intractable, without status migrainosus: Secondary | ICD-10-CM | POA: Insufficient documentation

## 2013-11-24 HISTORY — PX: COLONOSCOPY WITH PROPOFOL: SHX5780

## 2013-11-24 SURGERY — COLONOSCOPY WITH PROPOFOL
Anesthesia: Monitor Anesthesia Care

## 2013-11-24 MED ORDER — SODIUM CHLORIDE 0.9 % IV SOLN: INTRAVENOUS | Status: DC

## 2013-11-24 MED ORDER — LACTATED RINGERS IV SOLN
INTRAVENOUS | Status: DC
  Administered 2013-11-24: 09:00:00 via INTRAVENOUS

## 2013-11-24 MED ORDER — PROPOFOL 10 MG/ML IV BOLUS
INTRAVENOUS | Status: DC | PRN
  Administered 2013-11-24: 40 mg via INTRAVENOUS
  Administered 2013-11-24 (×2): 60 mg via INTRAVENOUS
  Administered 2013-11-24: 40 mg via INTRAVENOUS

## 2013-11-24 MED ORDER — GLYCOPYRROLATE 0.2 MG/ML IJ SOLN
INTRAMUSCULAR | Status: DC | PRN
  Administered 2013-11-24: 0.2 mg via INTRAVENOUS

## 2013-11-24 MED ORDER — PROPOFOL 10 MG/ML IV BOLUS
INTRAVENOUS | Status: AC
  Filled 2013-11-24: qty 20

## 2013-11-24 MED ORDER — GLYCOPYRROLATE 0.2 MG/ML IJ SOLN
INTRAMUSCULAR | Status: AC
Start: 2013-11-24 — End: 2013-11-24
  Filled 2013-11-24: qty 1

## 2013-11-24 SURGICAL SUPPLY — 21 items

## 2013-11-24 NOTE — H&P (Signed)
_                                                                                                                History of Present Illness:  Ms. Alyssa Reyes is a 58 year old white female status post right hemicolectomy for a large, sessile adenomas polyp in the cecum, here for follow-up colonoscopy.  She has no GI complaints.   Past Medical History  Diagnosis Date  . Migraines   . Anxiety   . Depression   . Colon polyp     IN CECUM - FOUND ON ROUTINE COLONOSCPY  . Cough     PT STATES SINUS DRAINAGE AND PT IS A SMOKER X 20  . Allergy     seasonal   Past Surgical History  Procedure Laterality Date  . Abdominal hysterectomy  1980s  . Breast surgery Bilateral     "Knot" removal   . Appendectomy  1980S  . Laparoscopic partial colectomy N/A 10/26/2012    Procedure: LAPAROSCOPIC PARTIAL COLECTOMY;;  Surgeon: Adin Hector, MD;  Location: [facility];  Service: General;  Laterality: N/A;  . Colonoscopy    . Polypectomy     family history includes Diabetes in her brother, mother, and sister; Heart disease in her brother, father, and mother; Hyperlipidemia in her brother and mother; Hypertension in her brother, mother, and sister; Stroke in her father. There is no history of Colon cancer, Rectal cancer, or Stomach cancer. Current Facility-Administered Medications  Medication Dose Route Frequency Provider Last Rate Last Dose  . 0.9 %  sodium chloride infusion   Intravenous Continuous Inda Castle, MD      . lactated ringers infusion   Intravenous Continuous Inda Castle, MD 10 mL/hr at 11/24/13 0831     Allergies as of 10/10/2013 - Review Complete 01/09/2013  Allergen Reaction Noted  . Codeine Nausea And Vomiting 10/20/2012  . Hydrocodone Nausea And Vomiting 10/26/2012  . Erythromycin Nausea And Vomiting 06/22/2012  . Tramadol Itching 10/26/2012    reports that she has been smoking Cigarettes.  She has a 20 pack-year smoking history. She has never used smokeless  tobacco. She reports that she drinks about 0.6 oz of alcohol per week. She reports that she does not use illicit drugs.   Review of Systems: Pertinent positive and negative review of systems were noted in the above HPI section. All other review of systems were otherwise negative.  Vital signs were reviewed in today's medical record Physical Exam: General: Well developed , well nourished, no acute distress Skin: anicteric Head: Normocephalic and atraumatic Eyes:  sclerae anicteric, EOMI Ears: Normal auditory acuity Mouth: No deformity or lesions Neck: Supple, no masses or thyromegaly Lungs: Clear throughout to auscultation Heart: Regular rate and rhythm; no murmurs, rubs or bruits Abdomen: Soft, non tender and non distended. No masses, hepatosplenomegaly or hernias noted. Normal Bowel sounds Rectal:deferred Musculoskeletal: Symmetrical with no gross deformities  Skin: No lesions on visible extremities Pulses:  Normal pulses noted Extremities: No clubbing, cyanosis, edema or deformities noted Neurological: Alert oriented x 4, grossly nonfocal Cervical Nodes:  No significant cervical adenopathy Inguinal Nodes: No significant inguinal adenopathy Psychological:  Alert and cooperative. Normal mood and affect  Impression-status post right hemicolectomy for large, sessile cecal polyp  Recommendations-follow-up colonoscopy

## 2013-11-24 NOTE — Op Note (Signed)
[facility] [place], [address]   COLONOSCOPY PROCEDURE REPORT     EXAM DATE: Nov 27, 2013  PATIENT NAME:      Alyssa Reyes, Alyssa Reyes           MR #:      [id number]  BIRTHDATE:       [birth year]      VISIT #:     [id number]  ATTENDING:     Inda Castle, MD     STATUS:     outpatient REFERRING MD:      Redge Gainer, M.D. ASA CLASS:        Class II  INDICATIONS:  The patient is a 58 yr old female here for a colonoscopy due to high risk personal history of colonic polyps. s/p right hemicolectomy for sessile cecal polyp one year ago PROCEDURE PERFORMED:     Colonoscopy, diagnostic MEDICATIONS:     Monitored anesthesia care ESTIMATED BLOOD LOSS:     None  CONSENT: The patient understands the risks and benefits of the procedure and understands that these risks include, but are not limited to: sedation, allergic reaction, infection, perforation and/or bleeding. Alternative means of evaluation and treatment include, among others: physical exam, x-rays, and/or surgical intervention. The patient elects to proceed with this endoscopic procedure.  DESCRIPTION OF PROCEDURE: During intra-op preparation period all mechanical & medical equipment was checked for proper function. Hand hygiene and appropriate measures for infection prevention was taken. After the risks, benefits and alternatives of the procedure were thoroughly explained, Informed consent was verified, confirmed and timeout was successfully executed by the treatment team. A digital exam revealed no abnormalities of the rectum.      The Pentax Ped Colon [id number] endoscope was introduced through the anus and advanced to the surgical anastomosis. No adverse events experienced. The prep was excellent using Suprep. The instrument was then slowly withdrawn as the colon was fully examined.   COLON FINDINGS: A normal appearing cecum, ileocecal valve, and appendiceal orifice were identified.  The  ascending, transverse, descending, sigmoid colon, and rectum appeared unremarkable. Retroflexed views revealed no abnormalities.  The scope was then completely withdrawn from the patient and the procedure terminated.  WITHDRAWAL TIME: 7 minutes 0 seconds    ADVERSE EVENTS:      There were no immediate complications.  IMPRESSIONS:     Normal colonoscopy  RECOMMENDATIONS:     Colonoscopy 5 years RECALL:  Inda Castle, MD eSigned:  Inda Castle, MD 2013/11/27 9:57 AM   cc:  CPT CODES: ICD CODES:  The ICD and CPT codes recommended by this software are interpretations from the data that the clinical staff has captured with the software.  The verification of the translation of this report to the ICD and CPT codes and modifiers is the sole responsibility of the health care institution and practicing physician where this report was generated.  [employer] will not be held responsible for the validity of the ICD and CPT codes included on this report.  AMA assumes no liability for data contained or not contained herein. [employer] is a Designer, television/film set of the [employer].

## 2013-11-24 NOTE — Anesthesia Preprocedure Evaluation (Signed)
Anesthesia Evaluation  Patient identified by MRN, date of birth, ID band Patient awake    Reviewed: Allergy & Precautions, H&P , NPO status , Patient's Chart, lab work & pertinent test results  Airway Mallampati: II  TM Distance: >3 FB Neck ROM: Full    Dental no notable dental hx.    Pulmonary Current Smoker,  breath sounds clear to auscultation  Pulmonary exam normal       Cardiovascular negative cardio ROS  Rhythm:Regular Rate:Normal     Neuro/Psych Anxiety negative neurological ROS     GI/Hepatic negative GI ROS, Neg liver ROS,   Endo/Other  negative endocrine ROS  Renal/GU negative Renal ROS  negative genitourinary   Musculoskeletal negative musculoskeletal ROS (+)   Abdominal   Peds negative pediatric ROS (+)  Hematology negative hematology ROS (+)   Anesthesia Other Findings   Reproductive/Obstetrics negative OB ROS                             Anesthesia Physical Anesthesia Plan  ASA: II  Anesthesia Plan: MAC   Post-op Pain Management:    Induction: Intravenous  Airway Management Planned: Nasal Cannula  Additional Equipment:   Intra-op Plan:   Post-operative Plan:   Informed Consent: I have reviewed the patients History and Physical, chart, labs and discussed the procedure including the risks, benefits and alternatives for the proposed anesthesia with the patient or authorized representative who has indicated his/her understanding and acceptance.   Dental advisory given  Plan Discussed with: CRNA and Surgeon  Anesthesia Plan Comments:         Anesthesia Quick Evaluation

## 2013-11-24 NOTE — Discharge Instructions (Signed)
Colonoscopy, Care After These instructions give you information on caring for yourself after your procedure. Your doctor may also give you more specific instructions. Call your doctor if you have any problems or questions after your procedure. HOME CARE  Do not drive for 24 hours.  Do not sign important papers or use machinery for 24 hours.  You may shower.  You may go back to your usual activities, but go slower for the first 24 hours.  Take rest breaks often during the first 24 hours.  Walk around or use warm packs on your belly (abdomen) if you have belly cramping or gas.  Drink enough fluids to keep your pee (urine) clear or pale yellow.  Resume your normal diet. Avoid heavy or fried foods.  Avoid drinking alcohol for 24 hours or as told by your doctor.  Only take medicines as told by your doctor. If a tissue sample (biopsy) was taken during the procedure:   Do not take aspirin or blood thinners for 7 days, or as told by your doctor.  Do not drink alcohol for 7 days, or as told by your doctor.  Eat soft foods for the first 24 hours. GET HELP IF: You still have a small amount of blood in your poop (stool) 2-3 days after the procedure. GET HELP RIGHT AWAY IF:  You have more than a small amount of blood in your poop.  You see clumps of tissue (blood clots) in your poop.  Your belly is puffy (swollen).  You feel sick to your stomach (nauseous) or throw up (vomit).  You have a fever.  You have belly pain that gets worse and medicine does not help. MAKE SURE YOU:  Understand these instructions.  Will watch your condition.  Will get help right away if you are not doing well or get worse. Document Released: 01/24/2010 Document Revised: 12/27/2012 Document Reviewed: 08/29/2012 [facility] Patient Information 2015 [place], [place]. This information is not intended to replace advice given to you by your health care provider. Make sure you discuss any questions you have with  your health care provider.   Monitored Anesthesia Care Monitored anesthesia care is an anesthesia service for a medical procedure. Anesthesia is the loss of the ability to feel pain. It is produced by medicines called anesthetics. It may affect a small area of your body (local anesthesia), a large area of your body (regional anesthesia), or your entire body (general anesthesia). The need for monitored anesthesia care depends your procedure, your condition, and the potential need for regional or general anesthesia. It is often provided during procedures where:   General anesthesia may be needed if there are complications. This is because you need special care when you are under general anesthesia.   You will be under local or regional anesthesia. This is so that you are able to have higher levels of anesthesia if needed.   You will receive calming medicines (sedatives). This is especially the case if sedatives are given to put you in a semi-conscious state of relaxation (deep sedation). This is because the amount of sedative needed to produce this state can be hard to predict. Too much of a sedative can produce general anesthesia. Monitored anesthesia care is performed by one or more health care providers who have special training in all types of anesthesia. You will need to meet with these health care providers before your procedure. During this meeting, they will ask you about your medical history. They will also give you instructions to  follow. (For example, you will need to stop eating and drinking before your procedure. You may also need to stop or change medicines you are taking.) During your procedure, your health care providers will stay with you. They will:   Watch your condition. This includes watching your blood pressure, breathing, and level of pain.   Diagnose and treat problems that occur.   Give medicines if they are needed. These may include calming medicines (sedatives) and  anesthetics.   Make sure you are comfortable.  Having monitored anesthesia care does not necessarily mean that you will be under anesthesia. It does mean that your health care providers will be able to manage anesthesia if you need it or if it occurs. It also means that you will be able to have a different type of anesthesia than you are having if you need it. When your procedure is complete, your health care providers will continue to watch your condition. They will make sure any medicines wear off before you are allowed to go home.  Document Released: 09/17/2004 Document Revised: 05/08/2013 Document Reviewed: 02/03/2012 [facility] Patient Information 2015 [place], [place]. This information is not intended to replace advice given to you by your health care provider. Make sure you discuss any questions you have with your health care provider.

## 2013-11-24 NOTE — Transfer of Care (Signed)
Immediate Anesthesia Transfer of Care Note  Patient: Alyssa Reyes  Procedure(s) Performed: Procedure(s) with comments: COLONOSCOPY WITH PROPOFOL (N/A) - colon with ERBE  Patient Location: PACU and Endoscopy Unit  Anesthesia Type:MAC  Level of Consciousness: awake, oriented, patient cooperative, lethargic and responds to stimulation  Airway & Oxygen Therapy: Patient Spontanous Breathing and Patient connected to face mask oxygen  Post-op Assessment: Report given to PACU RN, Post -op Vital signs reviewed and stable and Patient moving all extremities  Post vital signs: Reviewed and stable  Complications: No apparent anesthesia complications

## 2013-11-24 NOTE — Anesthesia Postprocedure Evaluation (Signed)
  Anesthesia Post-op Note  Patient: Alyssa Reyes  Procedure(s) Performed: Procedure(s) (LRB): COLONOSCOPY WITH PROPOFOL (N/A)  Patient Location: PACU  Anesthesia Type: MAC  Level of Consciousness: awake and alert   Airway and Oxygen Therapy: Patient Spontanous Breathing  Post-op Pain: mild  Post-op Assessment: Post-op Vital signs reviewed, Patient's Cardiovascular Status Stable, Respiratory Function Stable, Patent Airway and No signs of Nausea or vomiting  Last Vitals:  Filed Vitals:   11/24/13 0820  BP: 112/62  Temp: 36.4 C  Resp: 20    Post-op Vital Signs: stable   Complications: No apparent anesthesia complications

## 2013-11-27 ENCOUNTER — Encounter (HOSPITAL_COMMUNITY): Payer: Self-pay | Admitting: Gastroenterology

## 2014-04-05 ENCOUNTER — Telehealth: Payer: Self-pay | Admitting: *Deleted

## 2014-04-05 NOTE — Telephone Encounter (Signed)
NOT DUE FOR 5 YEARS    THIS RECALL WAS COMPLETED IN NOV 2015

## 2014-10-22 IMAGING — CR DG CHEST 2V
2 series · 2 of 2 positions shown · non-contrast
Comparison: None.

CLINICAL DATA: Preop colon resection

EXAM:
CHEST  2 VIEW

[w chest pa]
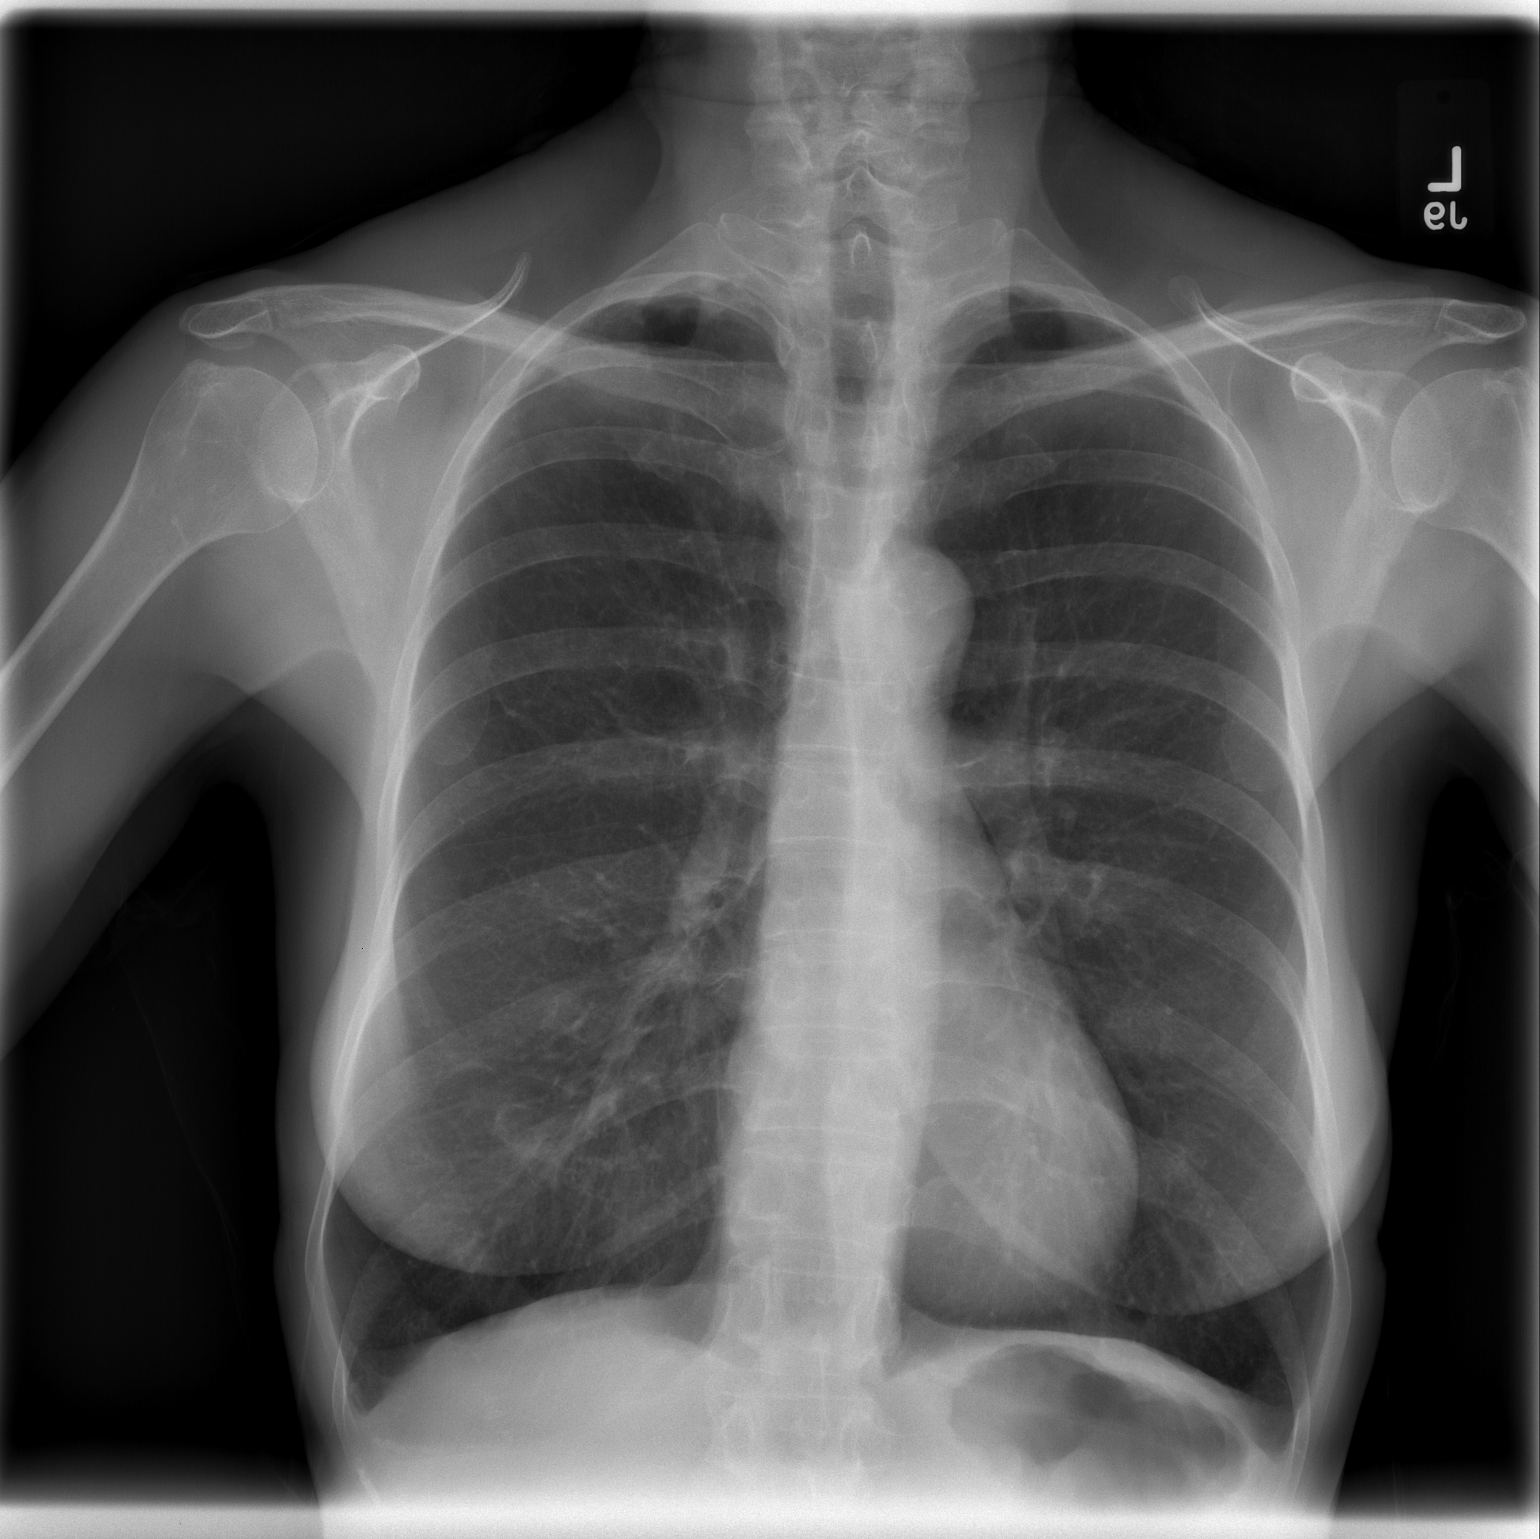

[w chest lat]
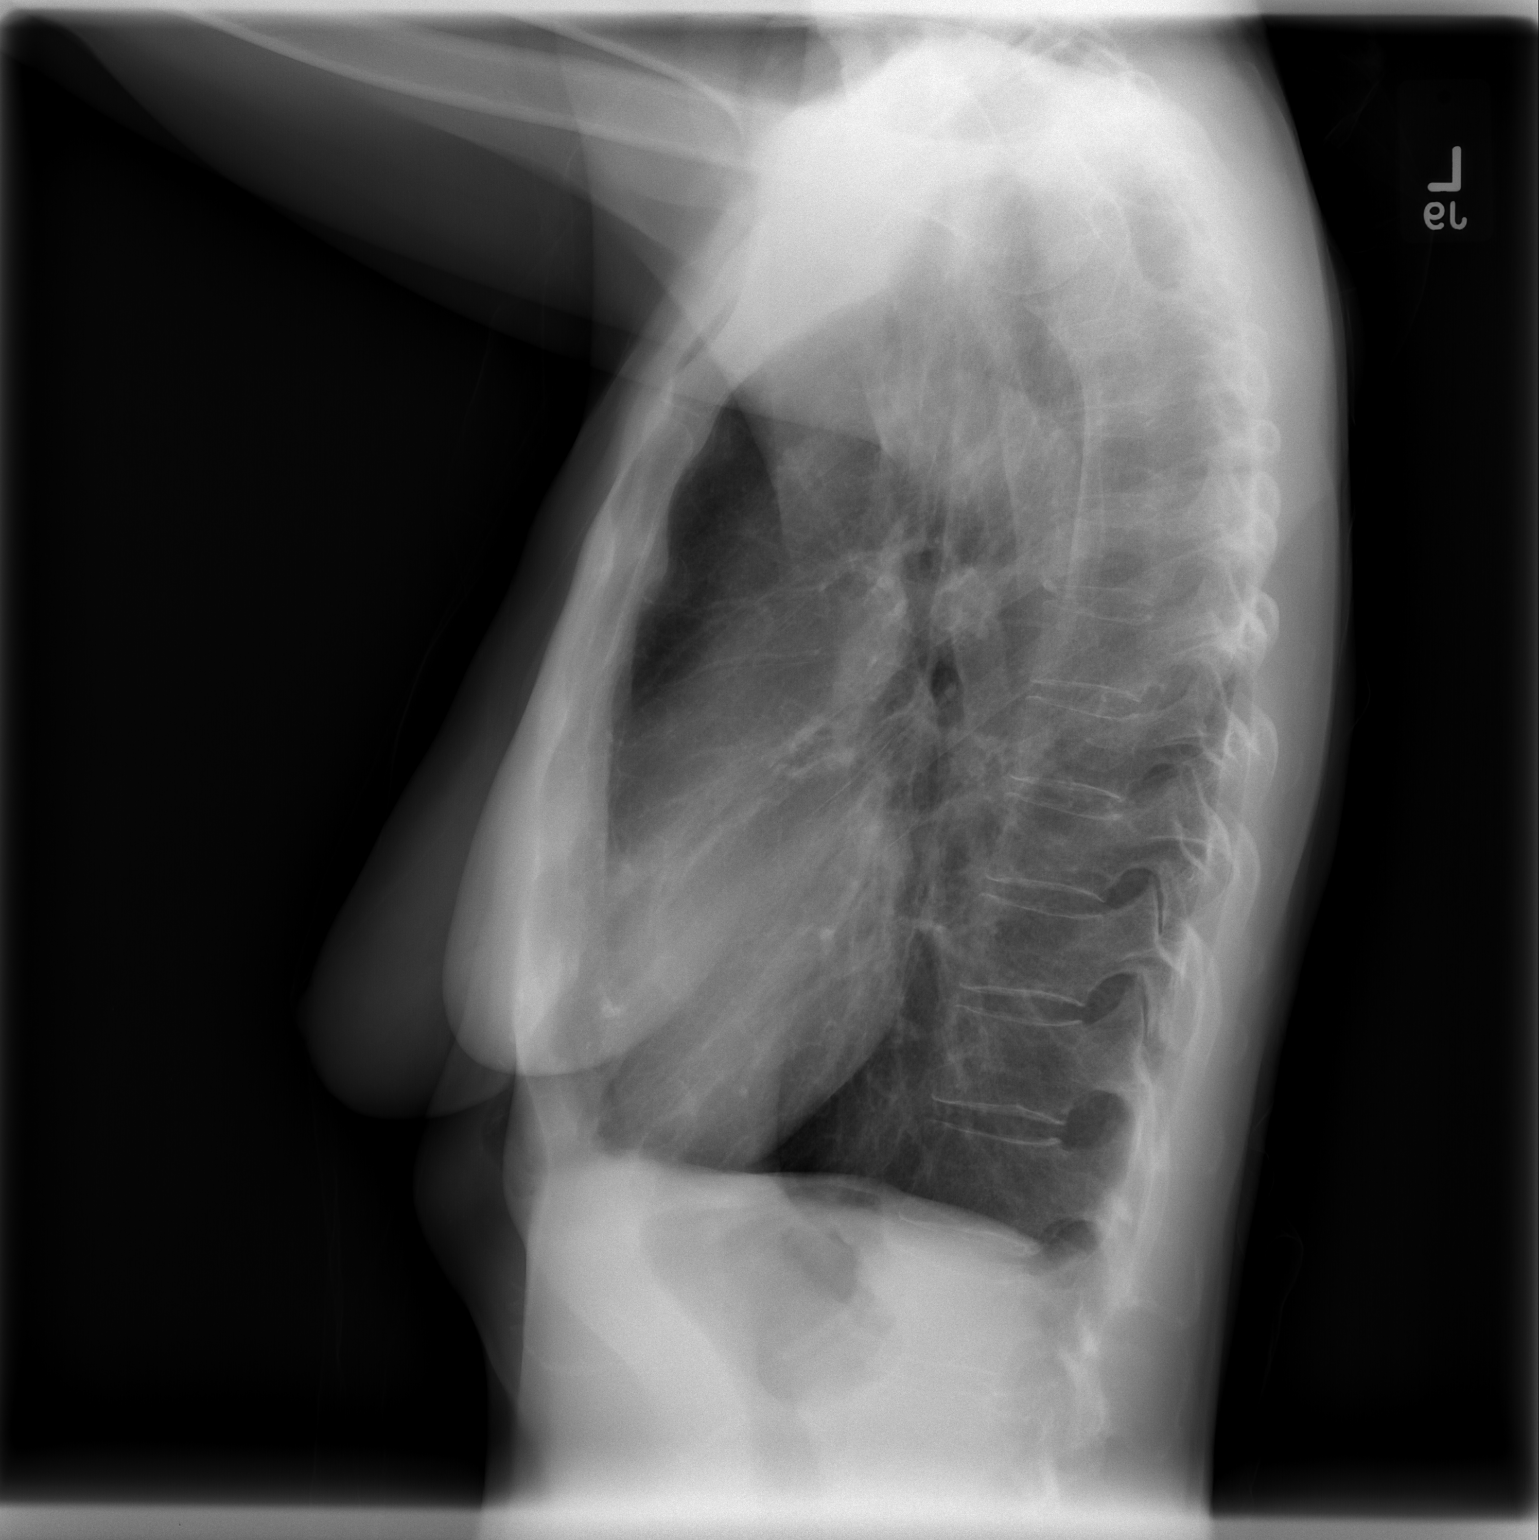

[2 of 2 positions shown; findings below may reference images not displayed]

FINDINGS: The heart size and mediastinal contours are within normal limits.
Both lungs are clear. The visualized skeletal structures are
unremarkable.
IMPRESSION: No active cardiopulmonary disease.

## 2015-05-27 ENCOUNTER — Telehealth: Payer: Self-pay | Admitting: Pediatrics

## 2015-05-27 NOTE — Telephone Encounter (Signed)
Please give pt note

## 2015-05-27 NOTE — Telephone Encounter (Signed)
Patient aware that Note is ready for pick up at the front desk

## 2018-01-20 ENCOUNTER — Encounter: Payer: Self-pay | Admitting: Physician Assistant

## 2018-10-13 ENCOUNTER — Encounter: Payer: Self-pay | Admitting: Gastroenterology

## 2018-10-19 ENCOUNTER — Encounter: Payer: Self-pay | Admitting: Gastroenterology

## 2021-01-22 DIAGNOSIS — J0100 Acute maxillary sinusitis, unspecified: Secondary | ICD-10-CM | POA: Diagnosis not present

## 2021-01-22 DIAGNOSIS — R051 Acute cough: Secondary | ICD-10-CM | POA: Diagnosis not present

## 2021-02-27 DIAGNOSIS — R69 Illness, unspecified: Secondary | ICD-10-CM | POA: Diagnosis not present

## 2021-02-27 DIAGNOSIS — I10 Essential (primary) hypertension: Secondary | ICD-10-CM | POA: Diagnosis not present

## 2021-02-27 DIAGNOSIS — Z1231 Encounter for screening mammogram for malignant neoplasm of breast: Secondary | ICD-10-CM | POA: Diagnosis not present

## 2021-02-27 DIAGNOSIS — E782 Mixed hyperlipidemia: Secondary | ICD-10-CM | POA: Diagnosis not present

## 2021-03-26 DIAGNOSIS — Z1231 Encounter for screening mammogram for malignant neoplasm of breast: Secondary | ICD-10-CM | POA: Diagnosis not present

## 2021-05-15 DIAGNOSIS — M79601 Pain in right arm: Secondary | ICD-10-CM | POA: Diagnosis not present

## 2021-08-18 DIAGNOSIS — R69 Illness, unspecified: Secondary | ICD-10-CM | POA: Diagnosis not present

## 2021-08-18 DIAGNOSIS — F331 Major depressive disorder, recurrent, moderate: Secondary | ICD-10-CM | POA: Diagnosis not present

## 2021-08-18 DIAGNOSIS — E782 Mixed hyperlipidemia: Secondary | ICD-10-CM | POA: Diagnosis not present

## 2021-08-18 DIAGNOSIS — Z0000 Encounter for general adult medical examination without abnormal findings: Secondary | ICD-10-CM | POA: Diagnosis not present

## 2021-08-18 DIAGNOSIS — I10 Essential (primary) hypertension: Secondary | ICD-10-CM | POA: Diagnosis not present

## 2021-08-18 DIAGNOSIS — F411 Generalized anxiety disorder: Secondary | ICD-10-CM | POA: Diagnosis not present
